# Patient Record
Sex: Male | Born: 2000
Health system: Southern US, Community
[De-identification: ages and names within clinical notes are randomized; demographics above are authoritative.]

## PROBLEM LIST (undated history)

## (undated) DIAGNOSIS — Z91018 Allergy to other foods: Secondary | ICD-10-CM

## (undated) DIAGNOSIS — K219 Gastro-esophageal reflux disease without esophagitis: Secondary | ICD-10-CM

## (undated) DIAGNOSIS — R001 Bradycardia, unspecified: Secondary | ICD-10-CM

## (undated) DIAGNOSIS — J302 Other seasonal allergic rhinitis: Secondary | ICD-10-CM

## (undated) DIAGNOSIS — S62509A Fracture of unspecified phalanx of unspecified thumb, initial encounter for closed fracture: Secondary | ICD-10-CM

## (undated) HISTORY — DX: Allergy to other foods: Z91.018

## (undated) HISTORY — PX: ADENOIDECTOMY: SUR15

## (undated) HISTORY — PX: TYMPANOSTOMY TUBE PLACEMENT: SHX32

## (undated) HISTORY — DX: Gastro-esophageal reflux disease without esophagitis: K21.9

## (undated) HISTORY — PX: TONSILLECTOMY: SUR1361

## (undated) NOTE — *Deleted (*Deleted)
   10/20/19 1816  Vital Signs  Temp 98.6 F (37 C)  Temp Source Oral  Pulse Rate 76  Pulse Rate Source Dinamap  Resp 16  BP 111/87  BP Location Left Arm  BP Method Automatic  Patient Position (if appropriate) Sitting  Oxygen Therapy  SpO2 99 %   D: Pt denies SI/HI/AVH. Patient denies anxiety and depression. Pt. Out in open areas. In the morning pt. Was irritable.  In the afternoon pt. Was out in open areas and was social with staff. Pt. Talked to this nurse about the language differences between the [place] and the [place]. Pt.. Was making sense in this conversation. Earlier pt. Stated that he had gone to [employer] and he was studying biology. He stated that he quit because he was  "too smart" A:  Patient took scheduled medicine.  Support and encouragement provided Routine safety checks conducted every 15 minutes. Patient  Informed to notify staff with any concerns.   R:  Safety maintained.

---
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
## 2018-08-27 DIAGNOSIS — J020 Streptococcal pharyngitis: Secondary | ICD-10-CM | POA: Diagnosis not present

## 2019-01-25 DIAGNOSIS — Z20828 Contact with and (suspected) exposure to other viral communicable diseases: Secondary | ICD-10-CM | POA: Diagnosis not present

## 2019-04-17 ENCOUNTER — Ambulatory Visit: Payer: BLUE CROSS/BLUE SHIELD

## 2019-04-18 ENCOUNTER — Ambulatory Visit: Payer: BC Managed Care – PPO | Attending: Pediatrics

## 2019-10-16 ENCOUNTER — Emergency Department (HOSPITAL_BASED_OUTPATIENT_CLINIC_OR_DEPARTMENT_OTHER)
Admission: EM | Admit: 2019-10-16 | Discharge: 2019-10-16 | Disposition: A | Discharge status: Home / Self Care | Payer: BC Managed Care – PPO | Attending: Emergency Medicine | Admitting: Emergency Medicine

## 2019-10-16 ENCOUNTER — Other Ambulatory Visit: Payer: Self-pay

## 2019-10-16 ENCOUNTER — Encounter (HOSPITAL_BASED_OUTPATIENT_CLINIC_OR_DEPARTMENT_OTHER): Payer: Self-pay | Admitting: *Deleted

## 2019-10-16 DIAGNOSIS — R4182 Altered mental status, unspecified: Secondary | ICD-10-CM | POA: Diagnosis present

## 2019-10-16 DIAGNOSIS — F19122 Other psychoactive substance abuse with intoxication with perceptual disturbances: Secondary | ICD-10-CM | POA: Insufficient documentation

## 2019-10-16 DIAGNOSIS — F1290 Cannabis use, unspecified, uncomplicated: Secondary | ICD-10-CM | POA: Diagnosis not present

## 2019-10-16 DIAGNOSIS — F19922 Other psychoactive substance use, unspecified with intoxication with perceptual disturbance: Secondary | ICD-10-CM

## 2019-10-16 DIAGNOSIS — F12122 Cannabis abuse with intoxication with perceptual disturbance: Secondary | ICD-10-CM | POA: Diagnosis not present

## 2019-10-16 DIAGNOSIS — E876 Hypokalemia: Secondary | ICD-10-CM | POA: Insufficient documentation

## 2019-10-16 LAB — CBC WITH DIFFERENTIAL/PLATELET
Abs Immature Granulocytes: 0.05 10*3/uL (ref 0.00–0.07)
Basophils Absolute: 0 10*3/uL (ref 0.0–0.1)
Basophils Relative: 0 %
Eosinophils Absolute: 0 10*3/uL (ref 0.0–0.5)
Eosinophils Relative: 0 %
HCT: 45.1 % (ref 39.0–52.0)
Hemoglobin: 15.1 g/dL (ref 13.0–17.0)
Immature Granulocytes: 0 %
Lymphocytes Relative: 10 %
Lymphs Abs: 1.1 10*3/uL (ref 0.7–4.0)
MCH: 30.8 pg (ref 26.0–34.0)
MCHC: 33.5 g/dL (ref 30.0–36.0)
MCV: 92 fL (ref 80.0–100.0)
Monocytes Absolute: 1 10*3/uL (ref 0.1–1.0)
Monocytes Relative: 9 %
Neutro Abs: 9.5 10*3/uL — ABNORMAL HIGH (ref 1.7–7.7)
Neutrophils Relative %: 81 %
Platelets: 294 10*3/uL (ref 150–400)
RBC: 4.9 MIL/uL (ref 4.22–5.81)
RDW: 12.4 % (ref 11.5–15.5)
WBC: 11.7 10*3/uL — ABNORMAL HIGH (ref 4.0–10.5)
nRBC: 0 % (ref 0.0–0.2)

## 2019-10-16 LAB — RAPID URINE DRUG SCREEN, HOSP PERFORMED
Amphetamines: NOT DETECTED
Barbiturates: NOT DETECTED
Benzodiazepines: NOT DETECTED
Cocaine: NOT DETECTED
Opiates: NOT DETECTED
Tetrahydrocannabinol: POSITIVE — AB

## 2019-10-16 LAB — URINALYSIS, ROUTINE W REFLEX MICROSCOPIC
Bilirubin Urine: NEGATIVE
Glucose, UA: NEGATIVE mg/dL
Hgb urine dipstick: NEGATIVE
Ketones, ur: 15 mg/dL — AB
Leukocytes,Ua: NEGATIVE
Nitrite: NEGATIVE
Protein, ur: NEGATIVE mg/dL
Specific Gravity, Urine: 1.015 (ref 1.005–1.030)
pH: 7.5 (ref 5.0–8.0)

## 2019-10-16 LAB — COMPREHENSIVE METABOLIC PANEL
ALT: 15 U/L (ref 0–44)
AST: 25 U/L (ref 15–41)
Albumin: 4.9 g/dL (ref 3.5–5.0)
Alkaline Phosphatase: 51 U/L (ref 38–126)
Anion gap: 11 (ref 5–15)
BUN: 10 mg/dL (ref 6–20)
CO2: 25 mmol/L (ref 22–32)
Calcium: 9.3 mg/dL (ref 8.9–10.3)
Chloride: 102 mmol/L (ref 98–111)
Creatinine, Ser: 1.1 mg/dL (ref 0.61–1.24)
GFR, Estimated: >60 mL/min (ref >60)
Glucose, Bld: 83 mg/dL (ref 70–99)
Potassium: 3.2 mmol/L — ABNORMAL LOW (ref 3.5–5.1)
Sodium: 138 mmol/L (ref 135–145)
Total Bilirubin: 1.5 mg/dL — ABNORMAL HIGH (ref 0.3–1.2)
Total Protein: 7.4 g/dL (ref 6.5–8.1)

## 2019-10-16 LAB — ETHANOL: Alcohol, Ethyl (B): <10 mg/dL (ref <10)

## 2019-10-16 LAB — SALICYLATE LEVEL: Salicylate Lvl: <7 mg/dL — ABNORMAL LOW (ref 7.0–30.0)

## 2019-10-16 LAB — ACETAMINOPHEN LEVEL: Acetaminophen (Tylenol), Serum: <10 ug/mL — ABNORMAL LOW (ref 10–30)

## 2019-10-16 MED ORDER — POTASSIUM CHLORIDE CRYS ER 20 MEQ PO TBCR
20.0000 meq | EXTENDED_RELEASE_TABLET | Freq: Once | ORAL | Status: AC
  Administered 2019-10-16: 20 meq via ORAL
  Filled 2019-10-16: qty 1

## 2019-10-16 MED ORDER — OLANZAPINE 5 MG PO TBDP
5.0000 mg | ORAL_TABLET | Freq: Every day | ORAL | Status: DC
  Administered 2019-10-16: 5 mg via ORAL
  Filled 2019-10-16: qty 1

## 2019-10-16 NOTE — ED Triage Notes (Signed)
Father states altered mental status today , unknown ingestion, " odd behavior"

## 2019-10-16 NOTE — ED Notes (Signed)
Pt attempted to void in urinal for urine sample that is needed, unable to void.

## 2019-10-16 NOTE — ED Notes (Signed)
Pt has eaten and feels better to go home, continues to exhibit bizzare behavior, leaving with father and went over d/c paperwork including behavioral health urgent care

## 2019-10-16 NOTE — ED Notes (Signed)
Pt is resting, no distress.  Offered pt and visitor snacks but they had ordered food. Gave 2 cokes

## 2019-10-16 NOTE — ED Notes (Signed)
Pt will remain in ED for a while longer as father is concerned about him not being himself.

## 2019-10-16 NOTE — Discharge Instructions (Signed)
If Kenneth Griffith develops any new or worsening symptoms, please return him to the emergency department for reevaluation.  It was a pleasure to meet you both.
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
## 2019-10-16 NOTE — ED Provider Notes (Addendum)
[facility] EMERGENCY DEPARTMENT Provider Note   CSN: [id number] Arrival date & time: 10/16/19  1554     History Chief Complaint  Patient presents with  . Altered Mental Status    Kenneth Griffith is a 19 y.o. male.  HPI   Patient is a 19 year old male with a medical history as noted below.  His father is at bedside and provides most of the patient's history.  He states that the patient is a [employer] student and [place] in [place].  He states that his son apparently went to a "rave" on Wednesday night.  Patient states that he smoked marijuana but denies any other drug use.  His father states that he came [place] to [place], [place] yesterday, unbeknownst to his parents, and then stayed at a friend's house who has a reputation for slipping people drugs.  Patient denies any known drug use yesterday. His father states this morning he was contacted because his son was driving to his mother's house in [place] and ran out of gas.  He was escorted to a gas station by police who helped him fill up his vehicle.  His father was contacted and followed him to his mother's house in [place].  He was later contacted by the patient's mother stating that he was "acting differently".  He notes to me that he feels that his son was acting differently this morning as well.  Patient denies any physical complaints at this time.  He appears diaphoretic and will at times provide odd answers to questions but otherwise is A&O x4.  Patient states he smokes marijuana and consumes marijuana regularly but denies any other drug use.  He does note taking mushrooms, LSD as well as using MDMA about 2 years ago during his freshman year of college but denies any repeat or recent occurrences.       Past Medical History:  Diagnosis Date  . Bradycardia    had cardiology workup in 2013:  "structurally normal heart"  . Food allergy    Tree Nuts  . Gastroesophageal reflux    no current med.  .  Seasonal allergies   . Thumb fracture 04/2014   right    Patient Active Problem List   Diagnosis Date Noted  . Anaphylaxis due to tree nut 08/04/2015  . Perennial allergic rhinitis 08/04/2015  . Other seasonal allergic rhinitis 08/04/2015  . 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 Gastroesophageal reflux     Past Surgical History:  Procedure Laterality Date  . ADENOIDECTOMY    . OPEN REDUCTION INTERNAL FIXATION (ORIF) METACARPAL Right 05/04/2014   Procedure: OPEN REDUCTION INTERNAL FIXATION (ORIF) RIGHT THUMB ;  Surgeon: Cindee SaltGary Kuzma, MD;  Location: [facility];  Service: Orthopedics;  Laterality: Right;  . PERCUTANEOUS PINNING  04/11/2011   Procedure: PERCUTANEOUS PINNING EXTREMITY;  Surgeon: Nicki ReaperGary R Kuzma, MD;  Location: [facility];  Service: Orthopedics;  Laterality: Right;  PERCUTANEOUS PINNING RIGHT LITTLE AND RING FINGERS  . TONSILLECTOMY  age 59 yrs.  . TYMPANOSTOMY TUBE PLACEMENT  as an infant      Family History  Problem Relation Age of Onset  . Diabetes Maternal Grandfather   . Kidney disease Maternal Grandfather        not on dialysis yet    Social History   Tobacco Use  . Smoking status: Never Smoker  . Smokeless tobacco: Never Used  Substance Use Topics  . Alcohol use: No  .  Drug use: Yes    Types: Marijuana    Home Medications Prior to Admission medications   Medication Sig Start Date End Date Taking? Authorizing Provider  amoxicillin-clavulanate (AUGMENTIN) 875-125 MG tablet Take 1 tablet by mouth every 12 (twelve) hours. 05/23/17   Elvina Sidle, MD  AUVI-Q 0.3 MG/0.3ML SOAJ injection Use as directed for life-threatening allergic reaction. 07/24/16   Kozlow, Alvira Philips, MD  EPINEPHrine (EPIPEN 2-PAK) 0.3 mg/0.3 mL IJ SOAJ injection Use as directed for life-threatening allergic reaction.    [provider]  loratadine (CLARITIN) 10 MG tablet Take 10 mg by mouth daily as needed for allergies.    [provider]    Allergies    Tree extract  Review of Systems   Review of Systems  All other systems reviewed and are negative. Ten systems reviewed and are negative for acute change, except as noted in the HPI.     Physical Exam Updated Vital Signs BP 122/86 (BP Location: Left Arm)   Pulse 76   Temp 98.3 F (36.8 C) (Oral)   Resp 18   Ht 5\' 9"  (1.753 m)   Wt 68 kg   SpO2 100%   BMI 22.15 kg/m   Physical Exam Vitals and nursing note reviewed.  Constitutional:      General: He is not in acute distress.    Appearance: Normal appearance. He is diaphoretic. He is not ill-appearing or toxic-appearing.     Comments: Mildly diaphoretic appearing.  Appears anxious.  He speaks in complete sentences.  At times, will provide inappropriate answers to questions.  HENT:     Head: Normocephalic and atraumatic.     Right Ear: External ear normal.     Left Ear: External ear normal.     Nose: Nose normal.     Mouth/Throat:     Mouth: Mucous membranes are moist.     Pharynx: Oropharynx is clear. No oropharyngeal exudate or posterior oropharyngeal erythema.  Eyes:     General: No scleral icterus.       Right eye: No discharge.        Left eye: No discharge.     Extraocular Movements: Extraocular movements intact.     Conjunctiva/sclera: Conjunctivae normal.     Pupils: Pupils are equal, round, and reactive to light.     Comments: Pupils mildly dilated.  They are equal, round, reactive to light.  Extraocular movements are intact.  Cardiovascular:     Rate and Rhythm: Normal rate and regular rhythm.     Pulses: Normal pulses.     Heart sounds: Normal heart sounds. No murmur heard.  No friction rub. No gallop.   Pulmonary:     Effort: Pulmonary effort is normal. No respiratory distress.     Breath sounds: Normal breath sounds. No stridor. No wheezing, rhonchi or rales.  Abdominal:     General: Abdomen is flat.     Tenderness: There is no abdominal tenderness.  Musculoskeletal:        General:  Normal range of motion.     Cervical back: Normal range of motion and neck supple. No tenderness.  Skin:    General: Skin is warm.  Neurological:     General: No focal deficit present.     Mental Status: He is alert and oriented to person, place, and time.     Comments: Patient is oriented to person, place, and time. Negative arm drift. Finger to nose intact bilaterally with no visible signs of dysmetria,  though patient was performing this test as fast as possible and had to be instructed to slow his movements down. Strength is 5/5 in all four extremities. Distal sensation intact in all four extremities.  Psychiatric:        Mood and Affect: Mood is anxious.        Behavior: Behavior normal. Behavior is not agitated, aggressive, hyperactive or combative.    ED Results / Procedures / Treatments   Labs (all labs ordered are listed, but only abnormal results are displayed) Labs Reviewed  COMPREHENSIVE METABOLIC PANEL - Abnormal; Notable for the following components:      Result Value   Potassium 3.2 (*)    Total Bilirubin 1.5 (*)    All other components within normal limits  URINALYSIS, ROUTINE W REFLEX MICROSCOPIC - Abnormal; Notable for the following components:   APPearance CLOUDY (*)    Ketones, ur 15 (*)    All other components within normal limits  RAPID URINE DRUG SCREEN, HOSP PERFORMED - Abnormal; Notable for the following components:   Tetrahydrocannabinol POSITIVE (*)    All other components within normal limits  CBC WITH DIFFERENTIAL/PLATELET - Abnormal; Notable for the following components:   WBC 11.7 (*)    Neutro Abs 9.5 (*)    All other components within normal limits  SALICYLATE LEVEL - Abnormal; Notable for the following components:   Salicylate Lvl <7.0 (*)    All other components within normal limits  ACETAMINOPHEN LEVEL - Abnormal; Notable for the following components:   Acetaminophen (Tylenol), Serum <10 (*)    All other components within normal limits    ETHANOL   EKG EKG Interpretation  Date/Time:  Friday October 16 2019 17:31:53 EDT Ventricular Rate:  54 PR Interval:    QRS Duration: 110 QT Interval:  442 QTC Calculation: 419 R Axis:   88 Text Interpretation: Sinus rhythm Short PR interval Confirmed by Tilden Fossa [phone]) on 10/16/2019 5:41:03 PM  Radiology No results found.  Procedures Procedures (including critical care time)  Medications Ordered in ED Medications  potassium chloride SA (KLOR-CON) CR tablet 20 mEq (20 mEq Oral Given 10/16/19 1849)    ED Course  I have reviewed the triage vital signs and the nursing notes.  Pertinent labs & imaging results that were available during my care of the patient were reviewed by me and considered in my medical decision making (see chart for details).  Clinical Course as of Oct 16 1846  Fri Oct 16, 2019  1810 Alcohol, Ethyl (B): <10 [LJ]  1811 Tetrahydrocannabinol(!): POSITIVE [LJ]    Clinical Course User Index [LJ] Placido Sou, PA-C   MDM Rules/Calculators/A&P                          Pt is a 19 y.o. male that presents with a history, physical exam, and ED Clinical Course as noted above.   Patient presents today with his father due to a possible ingestion.  He endorses marijuana use but denies any other recent drug use.  He was at a friend's house last night and is unsure if he was given any drugs during this encounter.  His father states that this morning he began exhibiting odd behavior and he ultimately brought him to the emergency department for evaluation.  Labs today were generally reassuring.  Mild leukocytosis of 11.7 with a neutrophilia of 9.5.  Likely reactive.  Hypokalemia of 3.2 which was repleted with Klor-Con.  UDS positive for  THC but otherwise negative. Patient's vital signs been stable throughout his visit.  He is afebrile, nontachycardic, normotensive, not hypoxic.  Patient was discussed with and evaluated by my attending physician.  Patient was  given a referral to peer support.  Father and the patient were offered to stay in the emergency department until his symptoms fully resolved for go home and return to the ED with any new or worsening symptoms.  They prefer to be discharged at this time and are planning on returning with any new or worsening symptoms.  Upon discharge, patient and his father decided that they would like to stay in the emergency department a while longer until patient is clinically sober.  It is the end of my shift and patient care will be transferred to my attending physician Dr. Tilden Fossa.  An After Visit Summary was printed and given to the patient.  Condition at discharge: Stable  Note: Portions of this report may have been transcribed using voice recognition software. Every effort was made to ensure accuracy; however, inadvertent computerized transcription errors may be present.   Final Clinical Impression(s) / ED Diagnoses Final diagnoses:  Drug intoxication with perceptual disturbance [facility])   Rx / DC Orders ED Discharge Orders    None       Placido Sou, PA-C 10/16/19 1850    Placido Sou, PA-C 10/16/19 1858    Tilden Fossa, MD 10/16/19 2353

## 2019-10-18 ENCOUNTER — Other Ambulatory Visit: Payer: Self-pay

## 2019-10-18 ENCOUNTER — Emergency Department (HOSPITAL_COMMUNITY)
Admission: EM | Admit: 2019-10-18 | Discharge: 2019-10-18 | Disposition: A | Discharge status: Home / Self Care | Payer: BC Managed Care – PPO | Source: Home / Self Care | Attending: Emergency Medicine | Admitting: Emergency Medicine

## 2019-10-18 ENCOUNTER — Encounter (HOSPITAL_COMMUNITY): Payer: Self-pay | Admitting: Emergency Medicine

## 2019-10-18 ENCOUNTER — Emergency Department (HOSPITAL_COMMUNITY): Payer: BC Managed Care – PPO

## 2019-10-18 ENCOUNTER — Ambulatory Visit (HOSPITAL_COMMUNITY)
Admission: EM | Admit: 2019-10-18 | Discharge: 2019-10-18 | Disposition: A | Discharge status: Other Acute Inpatient Hospital | Payer: BC Managed Care – PPO

## 2019-10-18 ENCOUNTER — Inpatient Hospital Stay (HOSPITAL_COMMUNITY)
Admission: AD | Admit: 2019-10-18 | Discharge: 2019-10-23 | DRG: 885 | Disposition: A | Discharge status: Home / Self Care | Payer: BC Managed Care – PPO | Source: Intra-hospital | Attending: Psychiatry | Admitting: Psychiatry

## 2019-10-18 DIAGNOSIS — F23 Brief psychotic disorder: Secondary | ICD-10-CM | POA: Insufficient documentation

## 2019-10-18 DIAGNOSIS — F12959 Cannabis use, unspecified with psychotic disorder, unspecified: Secondary | ICD-10-CM | POA: Insufficient documentation

## 2019-10-18 DIAGNOSIS — F19251 Other psychoactive substance dependence with psychoactive substance-induced psychotic disorder with hallucinations: Secondary | ICD-10-CM | POA: Insufficient documentation

## 2019-10-18 DIAGNOSIS — K30 Functional dyspepsia: Secondary | ICD-10-CM | POA: Diagnosis present

## 2019-10-18 DIAGNOSIS — Z792 Long term (current) use of antibiotics: Secondary | ICD-10-CM | POA: Diagnosis not present

## 2019-10-18 DIAGNOSIS — Z833 Family history of diabetes mellitus: Secondary | ICD-10-CM

## 2019-10-18 DIAGNOSIS — E876 Hypokalemia: Secondary | ICD-10-CM | POA: Diagnosis not present

## 2019-10-18 DIAGNOSIS — K5900 Constipation, unspecified: Secondary | ICD-10-CM | POA: Diagnosis not present

## 2019-10-18 DIAGNOSIS — K219 Gastro-esophageal reflux disease without esophagitis: Secondary | ICD-10-CM | POA: Diagnosis present

## 2019-10-18 DIAGNOSIS — Z20822 Contact with and (suspected) exposure to covid-19: Secondary | ICD-10-CM | POA: Diagnosis present

## 2019-10-18 DIAGNOSIS — F17200 Nicotine dependence, unspecified, uncomplicated: Secondary | ICD-10-CM | POA: Diagnosis not present

## 2019-10-18 DIAGNOSIS — F1290 Cannabis use, unspecified, uncomplicated: Secondary | ICD-10-CM | POA: Diagnosis not present

## 2019-10-18 DIAGNOSIS — J301 Allergic rhinitis due to pollen: Secondary | ICD-10-CM | POA: Diagnosis not present

## 2019-10-18 DIAGNOSIS — F29 Unspecified psychosis not due to a substance or known physiological condition: Secondary | ICD-10-CM | POA: Diagnosis not present

## 2019-10-18 DIAGNOSIS — R202 Paresthesia of skin: Secondary | ICD-10-CM | POA: Diagnosis not present

## 2019-10-18 DIAGNOSIS — Z87891 Personal history of nicotine dependence: Secondary | ICD-10-CM | POA: Insufficient documentation

## 2019-10-18 DIAGNOSIS — R4182 Altered mental status, unspecified: Secondary | ICD-10-CM | POA: Insufficient documentation

## 2019-10-18 DIAGNOSIS — Z79899 Other long term (current) drug therapy: Secondary | ICD-10-CM

## 2019-10-18 DIAGNOSIS — F419 Anxiety disorder, unspecified: Secondary | ICD-10-CM | POA: Diagnosis not present

## 2019-10-18 DIAGNOSIS — F1920 Other psychoactive substance dependence, uncomplicated: Secondary | ICD-10-CM | POA: Diagnosis not present

## 2019-10-18 DIAGNOSIS — R001 Bradycardia, unspecified: Secondary | ICD-10-CM | POA: Diagnosis not present

## 2019-10-18 DIAGNOSIS — Z7289 Other problems related to lifestyle: Secondary | ICD-10-CM | POA: Diagnosis not present

## 2019-10-18 LAB — CBC
HCT: 43 % (ref 39.0–52.0)
Hemoglobin: 13.8 g/dL (ref 13.0–17.0)
MCH: 30 pg (ref 26.0–34.0)
MCHC: 32.1 g/dL (ref 30.0–36.0)
MCV: 93.5 fL (ref 80.0–100.0)
Platelets: 284 10*3/uL (ref 150–400)
RBC: 4.6 MIL/uL (ref 4.22–5.81)
RDW: 12.3 % (ref 11.5–15.5)
WBC: 6.5 10*3/uL (ref 4.0–10.5)
nRBC: 0 % (ref 0.0–0.2)

## 2019-10-18 LAB — RAPID URINE DRUG SCREEN, HOSP PERFORMED
Amphetamines: NOT DETECTED
Barbiturates: NOT DETECTED
Benzodiazepines: NOT DETECTED
Cocaine: NOT DETECTED
Opiates: NOT DETECTED
Tetrahydrocannabinol: POSITIVE — AB

## 2019-10-18 LAB — COMPREHENSIVE METABOLIC PANEL
ALT: 20 U/L (ref 0–44)
AST: 40 U/L (ref 15–41)
Albumin: 4.6 g/dL (ref 3.5–5.0)
Alkaline Phosphatase: 55 U/L (ref 38–126)
Anion gap: 8 (ref 5–15)
BUN: 12 mg/dL (ref 6–20)
CO2: 27 mmol/L (ref 22–32)
Calcium: 9.4 mg/dL (ref 8.9–10.3)
Chloride: 104 mmol/L (ref 98–111)
Creatinine, Ser: 1.19 mg/dL (ref 0.61–1.24)
GFR, Estimated: >60 mL/min (ref >60)
Glucose, Bld: 78 mg/dL (ref 70–99)
Potassium: 3.7 mmol/L (ref 3.5–5.1)
Sodium: 139 mmol/L (ref 135–145)
Total Bilirubin: 1.8 mg/dL — ABNORMAL HIGH (ref 0.3–1.2)
Total Protein: 6.8 g/dL (ref 6.5–8.1)

## 2019-10-18 LAB — RESPIRATORY PANEL BY RT PCR (FLU A&B, COVID)
Influenza A by PCR: NEGATIVE
Influenza B by PCR: NEGATIVE
SARS Coronavirus 2 by RT PCR: NEGATIVE

## 2019-10-18 LAB — ACETAMINOPHEN LEVEL: Acetaminophen (Tylenol), Serum: <10 ug/mL — ABNORMAL LOW (ref 10–30)

## 2019-10-18 LAB — SALICYLATE LEVEL: Salicylate Lvl: <7 mg/dL — ABNORMAL LOW (ref 7.0–30.0)

## 2019-10-18 LAB — ETHANOL: Alcohol, Ethyl (B): <10 mg/dL (ref <10)

## 2019-10-18 NOTE — Progress Notes (Signed)
Patient has been accepted to Room 302-01 at [facility] to the service of MD Clary - pending negative COVID result and within normal limit CT scan that is ordered.  Report may be called to [phone] when transportation has been arranged.

## 2019-10-18 NOTE — ED Notes (Signed)
Dad, Abelina Bachelor, took all patient belongings home.

## 2019-10-18 NOTE — BH Assessment (Signed)
Comprehensive Clinical Assessment (CCA) Note  10/18/2019 Kenneth Griffith [id number]   Patient presents to [facility] with his father.  Patient is acutely psychotic, most likely from using marijuana.  His father reports that for the last two days that patient has been doing bizarre things.  He has pasted stuff all over the walls, he has removed screens from windows, he has injured the family dog, he has taken things apart in the house for unknown reasons.  Patient is agitated and very guarded and according to father, providing information that is not true.  Patient states that he is a daily marijuana user and states that he has been using marijuana since he was 19 years old.  Patient also admits to abusing alcohol, but amounts are unknown.  Father reports that patient attended a Rave Party approximately a week ago.  Patient denies other drug use other than marijuana.  Patient  denies current SI/HI and states that he has he has never tried to cause any harm to himself or others in the past and he states that he has never been treated for mental illness in the past.  Patient did not sleep at all last night.  He did not report any changes in his appetite.  Patient denies any history of abuse or self mutilation.  Patient is oriented and alert with the exception of the month, he thought it was September and he states that he does not remember doing the things that his father is reporting.  Patient's judgment, insight and impulse control are impaired. He is guarded and irritable and not invested in answering questions. His speech is pressured.  He avoids eye contact and has a bizarre affect.   Visit Diagnosis:      ICD-10-CM   1. Brief psychotic disorder (HCC)  F23     2.      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 Family/Friend  Referral name: Kenneth Griffith  Referral phone number: No data  recorded  Whom do you see for routine medical problems? Primary Care  Practice/Facility Name: Dr. Madilyn Griffith  Practice/Facility Phone Number: No data recorded Name of Contact: No data recorded Contact Number: No data recorded Contact Fax Number: No data recorded Prescriber Name: No data recorded Prescriber Address (if known): No data recorded  What Is the Reason for Your Visit/Call Today? Patient presented with substance induced psychosis. Father, states that patient's behavior has been bizarre for the past two days  How Long Has This Been Causing You Problems? 1 wk - 1 month  What Do You Feel Would Help You the Most Today? Other (Comment) (patient is too psychotic to identify level of care that he needs)   Have You Recently Been in Any Inpatient Treatment (Hospital/Detox/Crisis Center/28-Day Program)? No  Name/Location of Program/Hospital:No data recorded How Long Were You There? No data recorded When Were You Discharged? No data recorded  Have You Ever Received Services From [facility] Before? Yes  Who Do You See at [facility]? has been to ED for sport injuries   Have You Recently Had Any Thoughts About Hurting Yourself? No  Are You Planning to Commit Suicide/Harm Yourself At This time? No   Have you Recently Had Thoughts About Hurting Someone Kenneth Griffith? No  Explanation: No data recorded  Have  You Used Any Alcohol or Drugs in the Past 24 Hours? No (patient states that he last used marijuana and alcohol 2 days ago)  How Long Ago Did You Use Drugs or Alcohol? No data recorded What Did You Use and How Much? No data recorded  Do You Currently Have a Therapist/Psychiatrist? No  Name of Therapist/Psychiatrist: No data recorded  Have You Been Recently Discharged From Any Office Practice or Programs? No  Explanation of Discharge From Practice/Program: No data recorded    CCA Screening Triage Referral Assessment Type of Contact: Face-to-Face  Is this Initial or Reassessment?  No data recorded Date Telepsych consult ordered in [facility]:  No data recorded Time Telepsych consult ordered in [facility]:  No data recorded  Patient Reported Information Reviewed? Yes  Patient Left Without Being Seen? No data recorded Reason for Not Completing Assessment: No data recorded  Collateral Involvement: Father was present during the assessment process   Does Patient Have a Court Appointed Legal Guardian? No data recorded Name and Contact of Legal Guardian: No data recorded If Minor and Not Living with Parent(s), Who has Custody? No data recorded Is CPS involved or ever been involved? Never  Is APS involved or ever been involved? Never   Patient Determined To Be At Risk for Harm To Self or Others Based on Review of Patient Reported Information or Presenting Complaint? Yes, for Harm to Others (patient is psychotic and not rational, has not stated that he is homicidal or suicidal, but his behavior is currently unpredictable.)  Method: No Plan  Availability of Means: No access or NA  Intent: Vague intent or NA  Notification Required: No need or identified person  Additional Information for Danger to Others Potential: Active psychosis  Additional Comments for Danger to Others Potential: No data recorded Are There Guns or Other Weapons in Your Home? No  Types of Guns/Weapons: No data recorded Are These Weapons Safely Secured?                            No data recorded Who Could Verify You Are Able To Have These Secured: No data recorded Do You Have any Outstanding Charges, Pending Court Dates, Parole/Probation? none  Contacted To Inform of Risk of Harm To Self or Others: Other: Comment (parents are aware of situation and patient is being involuntarily committed)   [facility]   Does Patient Present under Involuntary Commitment? No (Petitioned by [facility] Provider)  IVC Papers Initial File Date: No data recorded  [place]   Patient Currently Receiving the Following Services: Not Receiving Services   Determination of Need: Emergent (2 hours)   Options For Referral: Inpatient Hospitalization     CCA Biopsychosocial  Intake/Chief Complaint:  CCA Intake With Chief Complaint CCA Part Two Date: 10/18/19 CCA Part Two Time: 1236 Chief Complaint/Presenting Problem: [facility].  Patient is acutely psychotic, most likely from using marijuana.  His father reports that for the last two days that patient has been doing bizarre things.  He has pasted stuff all over the walls, he has removed screens from windows, he has injured the family dog, he has taken things apart in the house for unknown reasons.  Patient is agitated and very guarded and according to father, providing information that is not true.  Patient states that he is a daily marijuana user and states that he has been using  marijuana since he was 19 years old.  Patient also admits to abusing alcohol, but amounts are unknown.  Father reports that patient attended a Rave Party approximately a week ago.  Patient denies other drug use other than marijuana.  Patient  denies current SI/HI and states that he has he has never tried to cause any harm to himself or others in the past and he states that he has never been treated for mental illness in the past.  Patient did not sleep at all last night.  He did not report any changes in his appetite.  Patient denies any history of abuse or self mutilation. Patient's Currently Reported Symptoms/Problems: Patient is exhibiting bizarre behaviors and he is very guarded Individual's Strengths: Patient is not cooperative with answering questions Individual's Preferences: Patient has no preferences that require accommodation Individual's Abilities: Patient is not cooperative with answering questions Type of Services Patient Feels Are Needed: Patient does not feel like he needs any mental  health services  Mental Health Symptoms Depression:  Depression: Change in energy/activity, Sleep (too much or little), Duration of symptoms less than two weeks  Mania:  Mania: Change in energy/activity, Irritability, Overconfidence, Recklessness  Anxiety:   Anxiety: None  Psychosis:  Psychosis: Duration of symptoms less than six months, Other negative symptoms  Trauma:  Trauma: None  Obsessions:  Obsessions: None  Compulsions:  Compulsions: None  Inattention:  Inattention: None  Hyperactivity/Impulsivity:  Hyperactivity/Impulsivity: N/A  Oppositional/Defiant Behaviors:  Oppositional/Defiant Behaviors: None  Emotional Irregularity:  Emotional Irregularity: Potentially harmful impulsivity  Other Mood/Personality Symptoms:      Mental Status Exam Appearance and self-care  Stature:  Stature: Average  Weight:  Weight: Thin  Clothing:  Clothing: Casual  Grooming:  Grooming: Normal  Cosmetic use:  Cosmetic Use: None  Posture/gait:  Posture/Gait: Normal  Motor activity:  Motor Activity: Not Remarkable  Sensorium  Attention:  Attention: Normal  Concentration:  Concentration: Normal  Orientation:  Orientation: Object, Person, Place, Situation  Recall/memory:  Recall/Memory: Normal  Affect and Mood  Affect:  Affect: Flat  Mood:  Mood: Irritable  Relating  Eye contact:  Eye Contact: Avoided  Facial expression:  Facial Expression: Tense  Attitude toward examiner:  Attitude Toward Examiner: Guarded, Irritable  Thought and Language  Speech flow: Speech Flow: Pressured  Thought content:  Thought Content: Appropriate to Mood and Circumstances  Preoccupation:  Preoccupations: None  Hallucinations:  Hallucinations: None  Organization:     Company secretary of Knowledge:  Fund of Knowledge: Good  Intelligence:  Intelligence: Above Average  Abstraction:  Abstraction: Functional  Judgement:  Judgement: Impaired  Reality Testing:  Reality Testing: Unaware  Insight:  Insight:  Lacking  Decision Making:  Decision Making: Impulsive  Social Functioning  Social Maturity:  Social Maturity: Impulsive  Social Judgement:  Social Judgement: Normal  Stress  Stressors:  Stressors: Family conflict, Work, Housing  Coping Ability:  Coping Ability: Exhausted, Normal  Skill Deficits:  Skill Deficits: Scientist, physiological  Supports:  Supports: Family     Religion: Religion/Spirituality Are You A Religious Person?: No  Leisure/Recreation: Leisure / Recreation Do You Have Hobbies?: Yes Leisure and Hobbies: hx of playing soccer  Exercise/Diet: Exercise/Diet Do You Exercise?: Yes What Type of Exercise Do You Do?: Run/Walk, Weight Training How Many Times a Week Do You Exercise?: 1-3 times a week Have You Gained or Lost A Significant Amount of Weight in the Past Six Months?: Yes-Lost Number of Pounds Lost?:  (amount unknown) Do You Follow a  Special Diet?: No Do You Have Any Trouble Sleeping?: No   CCA Employment/Education  Employment/Work Situation: Employment / Work Situation Employment situation: Unemployed Patient's job has been impacted by current illness: No What is the longest time patient has a held a job?: patient has only had one job that he kept for a couple months Where was the patient employed at that time?: [employer] Has patient ever been in the [employer]?: No  Education: Education Is Patient Currently Attending School?: No Last Grade Completed: 12 Name of High School: [employer] Did Garment/textile technologist From [employer]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 What Type of College Degree Do you Have?: [employer] Did You Attend Graduate School?: No What Was Your Major?: Pre-med then to business Did You Have An Individualized Education Program (IIEP): No Did You Have Any Difficulty At School?: No Patient's Education Has Been Impacted by Current Illness: No   CCA Family/Childhood History  Family and Relationship  History: Family history Marital status: Single Are you sexually active?: Yes What is your sexual orientation?: heterosexual Has your sexual activity been affected by drugs, alcohol, medication, or emotional stress?: unable to assess Does patient have children?: No  Childhood History:  Childhood History By whom was/is the patient raised?: Both parents Additional childhood history information: patient's parents are divorced and mother remarried.  Parents had joint custody Description of patient's relationship with caregiver when they were a child: Patient is closer to his father, argues with mother Patient's description of current relationship with people who raised him/her: Patient is closer to his father, argues with mother How were you disciplined when you got in trouble as a child/adolescent?: Patient was disciplined appropriately Does patient have siblings?: No Did patient suffer any verbal/emotional/physical/sexual abuse as a child?: No Did patient suffer from severe childhood neglect?: No Has patient ever been sexually abused/assaulted/raped as an adolescent or adult?: No Was the patient ever a victim of a crime or a disaster?: No Witnessed domestic violence?: No Has patient been affected by domestic violence as an adult?: No  Child/Adolescent Assessment:     CCA Substance Use  Alcohol/Drug Use: Alcohol / Drug Use Pain Medications: see MAR Prescriptions: see MAR Over the Counter: see MAR History of alcohol / drug use?: Yes Longest period of sobriety (when/how long): none reported Negative Consequences of Use: Personal relationships, Work / Mining engineer #1 Name of Substance 1: cannabis 1 - Age of First Use: 10 1 - Amount (size/oz): unknown 1 - Frequency: daily 1 - Duration: since onset 1 - Last Use / Amount: unknown                       ASAM's:  Six Dimensions of Multidimensional Assessment  Dimension 1:  Acute Intoxication and/or Withdrawal  Potential:   Dimension 1:  Description of individual's past and current experiences of substance use and withdrawal: Patient has no current withdrawal symptoms  Dimension 2:  Biomedical Conditions and Complications:   Dimension 2:  Description of patient's biomedical conditions and  complications: Patient has no current medical issues that are exacerbated by his use of marijuana  Dimension 3:  Emotional, Behavioral, or Cognitive Conditions and Complications:  Dimension 3:  Description of emotional, behavioral, or cognitive conditions and complications: Patient's marijuana use has induced psychosis  Dimension 4:  Readiness to Change:  Dimension 4:  Description of Readiness to Change criteria: Patient has not expressed any desire to change his drug use.  Dimension 5:  Relapse, Continued use, or Continued Problem Potential:  Dimension 5:  Relapse, continued use, or continued problem potential critiera description: Patient has no significant period of abstinence  Dimension 6:  Recovery/Living Environment:  Dimension 6:  Recovery/Iiving environment criteria description: Patient has a safe and supportive environment to recover in  ASAM Severity Score: ASAM's Severity Rating Score: 10  ASAM Recommended Level of Treatment: ASAM Recommended Level of Treatment: Level II Intensive Outpatient Treatment   Substance use Disorder (SUD) Substance Use Disorder (SUD)  Checklist Symptoms of Substance Use: Continued use despite persistent or recurrent social, interpersonal problems, caused or exacerbated by use, Presence of craving or strong urge to use, Recurrent use that results in a failure to fulfill major role obligations (work, school, home), Social, occupational, recreational activities given up or reduced due to use, Substance(s) often taken in larger amounts or over longer times than was intended  Recommendations for Services/Supports/Treatments: Recommendations for Services/Supports/Treatments Recommendations  For Services/Supports/Treatments: CD-IOP Intensive Chemical Dependency Program  DSM5 Diagnoses: Patient Active Problem List   Diagnosis Date Noted  . Cannabis-induced psychotic disorder (HCC)     Disposition:  Per Berneice Heinrich, NP, inpatient treatment is recommended.  Patient was placed on IVC.   Referrals to Alternative Service(s): Referred to Alternative Service(s):   Place:   Date:   Time:    Referred to Alternative Service(s):   Place:   Date:   Time:    Referred to Alternative Service(s):   Place:   Date:   Time:    Referred to Alternative Service(s):   Place:   Date:   Time:     Arnoldo Lenis Darby Fleeman

## 2019-10-18 NOTE — ED Provider Notes (Signed)
Behavioral Health Admission H&P [facility] & OBS)  Date: 10/18/19 Patient Name: Kenneth Griffith MRN: [id number] Chief Complaint: No chief complaint on file.     Diagnoses:  Final diagnoses:  Brief psychotic disorder [facility])    HPI: Patient presents voluntarily to [facility] behavioral health center for walk-in assessment.  Patient alert and oriented, reluctantly participates in assessment.  Patient states "I do not know why I am here."  Patient request that his father remain present during assessment.  Patient asked that father explain reasons for today's visit.  Patient's Lanny Cramp, reports increasingly bizarre behaviors x3 days.  Patient's father reports patient was seen at [facility] on Friday for similar symptoms. Patient was treated for substance induced psychosis.  Patient's father reports Zyprexa was initiated at that time.  Patient's father denies any radiologic studies were completed of patient's head to his knowledge.  Patient's father denies any family history of mental illness.  According to father, patient has not slept in approximately 48 hours.  Patient's father describes bizarre behaviors including physically assaulting the family dog x2 last night.  Patient also noted to be using tape measure to measure the family dog.  Patient reports dog was injured in a dog fight however according to patient's father this did not occur.  Per patient's father patient exhibited destructive behavior in his father's home.  In father's home patient removed screens from windows to "see more clearly." Patient noted by father to be disassembling items in home. Patient knocking on walls then writing on walls in fathers home.     Patient exhibits bizarre behavior on assessment.  Patient noted to hold up 2 fingers states "this means November."  Upon arrival patient registered as a 19 year old, patient actually 19 years old.  Patient reports on Wednesday he attended a "rave party" in [place].  Patient reports using alcohol and marijuana, patient reports "making edibles using marihuana."  Patient reports recent losing of time, patient reports periods of time when he cannot remember the things that have occurred.  Patient reports inability to recall some of the behaviors described by his father.  On evaluation patient appears labile and guarded.  Patient's speech is clear and coherent, tangential in nature.  Patient affect is anxious.  Patient denies suicidal ideations, denies any history of suicide attempts or self-harm behaviors.  Patient denies homicidal ideations.  Patient denies both auditory and visual hallucinations but pauses prior to answering this question.  Patient presents with disorganized conversation.  Patient reports "what if I need to put the dog in the car?"   Patient appears mildly paranoid.  Patient states I do not understand why people think I need to come here, I did not know this was a mental health hospital."  Patient resides in [place] with his father.  Patient denies access to weapons.  Patient reports he is currently working "doing a lot of things."  Patient's father reports patient is not employed.  Patient endorses rare alcohol use and daily marijuana use.  Patient states "I love marijuana."  Offered support and encouragement to patient and father.  Patient gives verbal consent to speak with father.  Spoke with patient's father who agrees with plan for involuntary commitment petition at this time.  Involuntary commitment petition completed.  PHQ 2-9:     Total Time spent with patient: 45 minutes  Musculoskeletal  Strength & Muscle Tone: within normal limits Gait & Station: normal Patient leans: N/A  Psychiatric Specialty Exam  Presentation General Appearance: Appropriate for  Environment  Eye Contact:Fair  Speech:Clear and Coherent  Speech Volume:Normal  Handedness:Right   Mood and Affect  Mood:Anxious;Irritable  Affect:Labile   Thought  Process  Thought Processes:Disorganized  Descriptions of Associations:Tangential  Orientation:Full (Time, Place and Person)  Thought Content:Abstract Reasoning;Tangential  Hallucinations:Hallucinations: None  Ideas of Reference:None  Suicidal Thoughts:Suicidal Thoughts: No  Homicidal Thoughts:Homicidal Thoughts: No   Sensorium  Memory:Immediate Poor;Recent Poor;Remote Fair  Judgment:Impaired  Insight:Lacking   Executive Functions  Concentration:Poor  Attention Span:Poor  Recall:Fair  Fund of Knowledge:Fair  Language:Fair   Psychomotor Activity  Psychomotor Activity:Psychomotor Activity: Normal   Assets  Assets:Communication Skills;Financial Resources/Insurance;Housing;Intimacy;Physical Health;Resilience;Social Support   Sleep  Sleep:Sleep: Poor   Physical Exam Vitals and nursing note reviewed.  Constitutional:      Appearance: He is well-developed.  HENT:     Head: Normocephalic.  Cardiovascular:     Rate and Rhythm: Normal rate.  Pulmonary:     Effort: Pulmonary effort is normal.  Neurological:     Mental Status: He is alert and oriented to person, place, and time.  Psychiatric:        Mood and Affect: Mood is anxious. Affect is labile and inappropriate.        Speech: Speech is tangential.        Behavior: Behavior is cooperative.        Thought Content: Thought content is paranoid.        Cognition and Memory: Cognition is impaired. He exhibits impaired recent memory.        Judgment: Judgment is inappropriate.    Review of Systems  Constitutional: Negative.   HENT: Negative.   Eyes: Negative.   Respiratory: Negative.   Cardiovascular: Negative.   Gastrointestinal: Negative.   Genitourinary: Negative.   Musculoskeletal: Negative.   Skin: Negative.   Neurological: Negative.   Endo/Heme/Allergies: Negative.   Psychiatric/Behavioral: Positive for substance abuse. The patient is nervous/anxious and has insomnia.     Blood pressure  (!) 144/91, pulse 80, temperature 98.4 F (36.9 C), temperature source Tympanic, SpO2 99 %. There is no height or weight on file to calculate BMI.  Past Psychiatric History: Substance induced psychosis   Is the patient at risk to self? Yes  Has the patient been a risk to self in the past 6 months? Yes .    Has the patient been a risk to self within the distant past? Yes   Is the patient a risk to others? No   Has the patient been a risk to others in the past 6 months? No   Has the patient been a risk to others within the distant past? No   Past Medical History: No past medical history on file.  Family History: No family history on file.  Social History:  Social History   Socioeconomic History  . Marital status: Not on file    Spouse name: Not on file  . Number of children: Not on file  . Years of education: Not on file  . Highest education level: Not on file  Occupational History  . Not on file  Tobacco Use  . Smoking status: Not on file  Substance and Sexual Activity  . Alcohol use: Not on file  . Drug use: Not on file  . Sexual activity: Not on file  Other Topics Concern  . Not on file  Social History Narrative  . Not on file   Social Determinants of Health   Financial Resource Strain:   . Difficulty of Paying Living  Expenses: Not on file  Food Insecurity:   . Worried About Programme researcher, broadcasting/film/video in the Last Year: Not on file  . Ran Out of Food in the Last Year: Not on file  Transportation Needs:   . Lack of Transportation (Medical): Not on file  . Lack of Transportation (Non-Medical): Not on file  Physical Activity:   . Days of Exercise per Week: Not on file  . Minutes of Exercise per Session: Not on file  Stress:   . Feeling of Stress : Not on file  Social Connections:   . Frequency of Communication with Friends and Family: Not on file  . Frequency of Social Gatherings with Friends and Family: Not on file  . Attends Religious Services: Not on file  . Active  Member of Clubs or Organizations: Not on file  . Attends Banker Meetings: Not on file  . Marital Status: Not on file  Intimate Partner Violence:   . Fear of Current or Ex-Partner: Not on file  . Emotionally Abused: Not on file  . Physically Abused: Not on file  . Sexually Abused: Not on file    SDOH:  SDOH Screenings   Alcohol Screen:   . Last Alcohol Screening Score (AUDIT): Not on file  Depression (PHQ2-9):   . PHQ-2 Score: Not on file  Financial Resource Strain:   . Difficulty of Paying Living Expenses: Not on file  Food Insecurity:   . Worried About Programme researcher, broadcasting/film/video in the Last Year: Not on file  . Ran Out of Food in the Last Year: Not on file  Housing:   . Last Housing Risk Score: Not on file  Physical Activity:   . Days of Exercise per Week: Not on file  . Minutes of Exercise per Session: Not on file  Social Connections:   . Frequency of Communication with Friends and Family: Not on file  . Frequency of Social Gatherings with Friends and Family: Not on file  . Attends Religious Services: Not on file  . Active Member of Clubs or Organizations: Not on file  . Attends Banker Meetings: Not on file  . Marital Status: Not on file  Stress:   . Feeling of Stress : Not on file  Tobacco Use:   . Smoking Tobacco Use: Not on file  . Smokeless Tobacco Use: Not on file  Transportation Needs:   . Lack of Transportation (Medical): Not on file  . Lack of Transportation (Non-Medical): Not on file    Last Labs:  No results found for any previous visit.    Allergies: Patient has no allergy information on record.  PTA Medications: (Not in a hospital admission)   Medical Decision Making  Patient will be involuntarily committed and transferred to the emergency department for medical clearance and to await appropriate inpatient psychiatric placement.  Patient presents with psychosis, possibly related to substance use disorder.  Discussed restarting  Zyprexa with patient.  Patient could benefit from Zyprexa 5 mg twice daily to address symptoms of psychosis.    Recommendations  Based on my evaluation the patient appears to have an emergency medical condition for which I recommend the patient be transferred to the emergency department for further evaluation.   Patient reviewed with Dr. Nelly Rout. Inpatient psychiatric treatment recommended.  Patrcia Dolly, FNP 10/18/19  11:51 AM

## 2019-10-18 NOTE — ED Notes (Signed)
Dad is leaving ED and going home.  Dad states that since he picked pt up from his mothers hours 2 days ago, he has not been acting usual self.  For instance, dad states pt has been walking around house with air gun and cocking it; took things out of dads office and laid them on the floor; put leash around dogs neck and was trying to lift him by leash for brief time; the dog does have a leg injury and dad doesn't know how that happened.  At pts mothers house last week pt turned furniture upside down in the house; pt took big rocks and put on hood of his car.

## 2019-10-18 NOTE — ED Triage Notes (Signed)
Pt to ED with [employer] and his dad.  Presents with IVC papers from [facility].  Pt states he was diagnosed with cannabis induced psychosis and dad reports odd behavior.  Pt denies SI/HI.  Cooperative at this time.  Dad states he was seen at [facility] on Friday.

## 2019-10-18 NOTE — ED Notes (Signed)
Pt belongings with family member and changed into purple scrubs Wanded by security  [employer] still with pt in triage Pt given [place] sandwich and water

## 2019-10-18 NOTE — ED Provider Notes (Signed)
[place] EMERGENCY DEPARTMENT Provider Note   CSN: [id number] Arrival date & time: 10/18/19  1334     History Chief Complaint  Patient presents with  . IVC    Kenneth Griffith is a 19 y.o. male who presents to the ED under IVC placed by [facility] provider.   IVC paperwork states: "Respondent is acutely psychotic must likely drug induced. His behaviors have been bizarre and out of the ordinary. He did not sleep last night and was up all night pasting things on the wall, he took screens out of windows, injured the family pet, taking things apart in the home. He is currently very agitated and unable to make rational decisions and is a danger to self/others in his current state of mind."   Per chart review: Pt was seen at [facility] ED on 10/08 for similar bizarre behavior. Labwork showed hypokalemia of 3.2 and a UDS only positive for THC. Pt/father stayed in the ED until pt's symptoms seemed to improve and he was discharged home. Please see previous ED Note for additional information.   Father reports that since then pt has continued to act very strangely. He states that he has been taking screens off of windows in the home for unknown reasons. He also mentions that pt has been taking trash out of the trashcan and placing it back into the sink. Father found pt rummaging through his office yesterday and noticed that he had a few screws in his hand - dad assumed he disassembled something however he is unsure what he disassembled. Dad noticed today that their dog has a wound on its side and is unsure how it got there however he found pt standing near the dog with a tape measure extended towards the dog and a "tooth whitening kit" in his mouth - when dad asked pt was happened he responded "the dog attacked me." Dad took pt to [facility] earlier today and he was sent here under IVC paperwork placed by [facility] provider. Pt denies SI, HI, or AVH. Pt thinks his behavior is completely normal.   The history  is provided by the patient, a parent and medical records.       Past Medical History:  Diagnosis Date  . Bradycardia    had cardiology workup in 2013:  "structurally normal heart"  . Food allergy    Tree Nuts  . Gastroesophageal reflux    no current med.  . Seasonal allergies   . Thumb fracture 04/2014   right    Patient Active Problem List   Diagnosis Date Noted  . Anaphylaxis due to tree nut 08/04/2015  . Perennial allergic rhinitis 08/04/2015  . Other seasonal allergic rhinitis 08/04/2015  . 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 Gastroesophageal reflux     Past Surgical History:  Procedure Laterality Date  . ADENOIDECTOMY    . OPEN REDUCTION INTERNAL FIXATION (ORIF) METACARPAL Right 05/04/2014   Procedure: OPEN REDUCTION INTERNAL FIXATION (ORIF) RIGHT THUMB ;  Surgeon: Daryll Brod, MD;  Location: [place];  Service: Orthopedics;  Laterality: Right;  . PERCUTANEOUS PINNING  04/11/2011   Procedure: PERCUTANEOUS PINNING EXTREMITY;  Surgeon: Wynonia Sours, MD;  Location: [place];  Service: Orthopedics;  Laterality: Right;  PERCUTANEOUS PINNING RIGHT LITTLE AND RING FINGERS  . TONSILLECTOMY  age 20 yrs.  . TYMPANOSTOMY TUBE PLACEMENT  as an infant       Family History  Problem Relation Age of Onset  .  Diabetes Maternal Grandfather   . Kidney disease Maternal Grandfather        not on dialysis yet    Social History   Tobacco Use  . Smoking status: Former Research scientist (life sciences)  . Smokeless tobacco: Never Used  Substance Use Topics  . Alcohol use: Yes  . Drug use: Yes    Types: Marijuana    Home Medications Prior to Admission medications   Medication Sig Start Date End Date Taking? Authorizing Provider  amoxicillin-clavulanate (AUGMENTIN) 875-125 MG tablet Take 1 tablet by mouth every 12 (twelve) hours. 05/23/17   Robyn Haber, MD  AUVI-Q 0.3 MG/0.3ML SOAJ injection Use as directed for life-threatening allergic reaction. 07/24/16    Kozlow, Donnamarie Poag, MD  EPINEPHrine (EPIPEN 2-PAK) 0.3 mg/0.3 mL IJ SOAJ injection Use as directed for life-threatening allergic reaction.    [provider]  loratadine (CLARITIN) 10 MG tablet Take 10 mg by mouth daily as needed for allergies.    [provider]    Allergies    Tree extract  Review of Systems   Review of Systems  Constitutional: Negative for fever.  Psychiatric/Behavioral: Positive for behavioral problems.    Physical Exam Updated Vital Signs BP 120/70 (BP Location: Right Arm)   Pulse (!) 57   Temp 98.2 F (36.8 C)   Resp 16   Ht '5\' 10"'  (1.778 m)   Wt 68 kg   SpO2 100%   BMI 21.52 kg/m   Physical Exam Vitals and nursing note reviewed.  Constitutional:      Appearance: He is not ill-appearing or diaphoretic.  HENT:     Head: Normocephalic and atraumatic.  Eyes:     Extraocular Movements: Extraocular movements intact.     Conjunctiva/sclera: Conjunctivae normal.     Pupils: Pupils are equal, round, and reactive to light.  Cardiovascular:     Rate and Rhythm: Normal rate and regular rhythm.     Pulses: Normal pulses.  Pulmonary:     Effort: Pulmonary effort is normal.     Breath sounds: Normal breath sounds. No wheezing, rhonchi or rales.  Abdominal:     Palpations: Abdomen is soft.     Tenderness: There is no abdominal tenderness.  Musculoskeletal:     Cervical back: Neck supple.  Skin:    General: Skin is warm and dry.  Neurological:     Mental Status: He is alert.     Comments: CN 3-12 grossly intact A&O x4 GCS 15 Sensation and strength intact Gait nonataxic including with tandem walking Coordination with finger-to-nose WNL Neg romberg, neg pronator drift     ED Results / Procedures / Treatments   Labs (all labs ordered are listed, but only abnormal results are displayed) Labs Reviewed  COMPREHENSIVE METABOLIC PANEL - Abnormal; Notable for the following components:      Result Value   Total Bilirubin 1.8 (*)    All  other components within normal limits  SALICYLATE LEVEL - Abnormal; Notable for the following components:   Salicylate Lvl <1.6 (*)    All other components within normal limits  ACETAMINOPHEN LEVEL - Abnormal; Notable for the following components:   Acetaminophen (Tylenol), Serum <10 (*)    All other components within normal limits  RAPID URINE DRUG SCREEN, HOSP PERFORMED - Abnormal; Notable for the following components:   Tetrahydrocannabinol POSITIVE (*)    All other components within normal limits  RESPIRATORY PANEL BY RT PCR (FLU A&B, COVID)  ETHANOL  CBC    EKG None  Radiology CT Head Wo Contrast  Result Date: 10/18/2019 CLINICAL DATA:  19 year old male with altered mental status. EXAM: CT HEAD WITHOUT CONTRAST TECHNIQUE: Contiguous axial images were obtained from the base of the skull through the vertex without intravenous contrast. COMPARISON:  None. FINDINGS: Brain: No evidence of acute infarction, hemorrhage, hydrocephalus, extra-axial collection or mass lesion/mass effect. Vascular: No hyperdense vessel or unexpected calcification. Skull: Normal. Negative for fracture or focal lesion. Sinuses/Orbits: There is diffuse mucoperiosteal thickening of paranasal sinuses. There is complete opacification of the right frontal sinus. Right maxillary sinus retention cyst or polyp. No air-fluid level. The mastoid air cells are clear. Other: None IMPRESSION: 1. Normal noncontrast CT of the brain. 2. Paranasal sinus disease. Electronically Signed   By: Anner Crete M.D.   On: 10/18/2019 17:30    Procedures Procedures (including critical care time)  Medications Ordered in ED Medications - No data to display  ED Course  I have reviewed the triage vital signs and the nursing notes.  Pertinent labs & imaging results that were available during my care of the patient were reviewed by me and considered in my medical decision making (see chart for details).    MDM Rules/Calculators/A&P                           19 year old male who presents to the ED after being placed under IVC paperwork by [facility] provider earlier today for drug-induced psychosis.  He was seen in the ED at [facility] 2 days ago for similar behavior and had a UDS positive for THC only.  Patient reports he went to a rate of early last week and ate a pot brownie and smoked some marijuana, he denies any other drug use.  Question if the marijuana was laced with something however father states that before he went to the rave on Wednesday he was completely fine.  On arrival to the ED patient is afebrile, nontachycardic nontachypneic.  He appears to be in no acute distress.  He is cooperative during exam however states that all of the behavior that dad is listing is completely normal.  He does not think anything is wrong.  He denies SI, HI, AVH.  Dad does mention that at [facility] they mentioned something about a CT scan of his head - I spoke with Ricky Ala, NP who saw patient earlier today; she asked dad if a CT scan was done during the earlier ED visit 2 days ago which it does not appear it was. Pt is without any focal neuro deficit on exam however given new altered mental status will plan for CT head. Will plan to medically screen pt and then have TTS eval.   CT Head negative Labwork without any abnormalities and potassium appears normalized toady at 3.7. UDS positive for THC. Pt is medically cleared at this time. Awaiting TTS eval.   Appears to be a note stating that pt has been accepted to [facility] pending negative COVID test. He has pre admission orders however continues to not have a note from a provider at either [facility] or TTS.   COVID test negative. Pt to be transferred to [facility]. Father Gardner Candle made aware.   This note was prepared using Dragon voice recognition software and may include unintentional dictation errors due to the inherent limitations of voice recognition software.  Final Clinical Impression(s) / ED  Diagnoses Final diagnoses:  Psychosis, unspecified psychosis type ([place])  Rx / DC Orders ED Discharge Orders    None       Eustaquio Maize, PA-C 10/18/19 2214    Isla Pence, MD 10/19/19 [phone]

## 2019-10-19 ENCOUNTER — Encounter (HOSPITAL_COMMUNITY): Payer: Self-pay

## 2019-10-19 ENCOUNTER — Other Ambulatory Visit: Payer: Self-pay

## 2019-10-19 DIAGNOSIS — F29 Unspecified psychosis not due to a substance or known physiological condition: Secondary | ICD-10-CM | POA: Diagnosis present

## 2019-10-19 DIAGNOSIS — F23 Brief psychotic disorder: Principal | ICD-10-CM

## 2019-10-19 MED ORDER — HYDROXYZINE HCL 25 MG PO TABS
25.0000 mg | ORAL_TABLET | Freq: Three times a day (TID) | ORAL | Status: DC | PRN
  Administered 2019-10-20 – 2019-10-21 (×3): 25 mg via ORAL
  Filled 2019-10-19 (×5): qty 1

## 2019-10-19 MED ORDER — TRAZODONE HCL 50 MG PO TABS
50.0000 mg | ORAL_TABLET | Freq: Every evening | ORAL | Status: DC | PRN
  Administered 2019-10-19: 50 mg via ORAL
  Filled 2019-10-19 (×2): qty 1

## 2019-10-19 MED ORDER — ACETAMINOPHEN 325 MG PO TABS
650.0000 mg | ORAL_TABLET | Freq: Four times a day (QID) | ORAL | Status: DC | PRN
  Administered 2019-10-21: 650 mg via ORAL
  Filled 2019-10-19: qty 2

## 2019-10-19 MED ORDER — MAGNESIUM HYDROXIDE 400 MG/5ML PO SUSP: 30.0000 mL | Freq: Every day | ORAL | Status: DC | PRN

## 2019-10-19 MED ORDER — OLANZAPINE 5 MG PO TBDP
5.0000 mg | ORAL_TABLET | Freq: Two times a day (BID) | ORAL | Status: DC
  Administered 2019-10-19 – 2019-10-20 (×3): 5 mg via ORAL
  Filled 2019-10-19 (×7): qty 1

## 2019-10-19 MED ORDER — ALUM & MAG HYDROXIDE-SIMETH 200-200-20 MG/5ML PO SUSP: 30.0000 mL | ORAL | Status: DC | PRN

## 2019-10-19 NOTE — Tx Team (Signed)
Initial Treatment Plan 10/19/2019 12:54 AM Kenneth Griffith KGS:[id number]    PATIENT STRESSORS: Financial difficulties Substance abuse   PATIENT STRENGTHS: Average or above average intelligence Communication skills Supportive family/friends   PATIENT IDENTIFIED PROBLEMS: Psychosis  Substance abuse        "learn to better communicate"           DISCHARGE CRITERIA:  Improved stabilization in mood, thinking, and/or behavior Motivation to continue treatment in a less acute level of care Need for constant or close observation no longer present  PRELIMINARY DISCHARGE PLAN: Return to previous living arrangement  PATIENT/FAMILY INVOLVEMENT: This treatment plan has been presented to and reviewed with the patient, Kenneth Griffith.  The patient and family have been given the opportunity to ask questions and make suggestions.  Juliann Pares, RN 10/19/2019, 12:54 AM
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
## 2019-10-19 NOTE — [facility] Suicide Risk Assessment (Signed)
[facility] INPATIENT:  Family/Significant Other Suicide Prevention Education  Suicide Prevention Education:  Education Completed; Gibson Lad [phone] (Father) has been identified by the patient as the family member/significant other with whom the patient will be residing, and identified as the person(s) who will aid the patient in the event of a mental health crisis (suicidal ideations/suicide attempt).  With written consent from the patient, the family member/significant other has been provided the following suicide prevention education, prior to the and/or following the discharge of the patient.  The suicide prevention education provided includes the following:  Suicide risk factors  Suicide prevention and interventions  National Suicide Hotline telephone number  [facility] assessment telephone number  [facility] Emergency Assistance 911  [facility] and/or Residential Mobile Crisis Unit telephone number  Request made of family/significant other to:  Remove weapons (e.g., guns, rifles, knives), all items previously/currently identified as safety concern.    Remove drugs/medications (over-the-counter, prescriptions, illicit drugs), all items previously/currently identified as a safety concern.  The family member/significant other verbalizes understanding of the suicide prevention education information provided.  The family member/significant other agrees to remove the items of safety concern listed above.   Mr. Jaquith states that over the weekend his son was acting strange.  Mr. Conover states that his son went to [facility] to see friends.  Mr. Bones states that his son was there for one week and then came home on Friday.  Mr. Selsor states that his son was brought home by police because the car was out of gas.  Mr. Janusz states that once he had gas in the car his son went to his mother's house where it sounded like he was Hallucinating because he kept  stating that his step-father was in the home when he was not.  Mr. Shepard states that his son has always been honest about what he does and states that he smoked Marijuana at a Rave while at the [place].  Mr. Claiborne states that his son smokes Marijuana often and has admitted to drinking alcohol.  Mr. Vanderwall states that the firearms and weapons in his home are locked up and secure.  Mr. Nealy states that his son's mother has also locked up all firearms and weapons.  Mr. Acuna asked the CSW many questions and states that he was hoping that the behavior would be attributed to Diabetes.  Mr. Mitcham states that he does not want to believe that this is a mental health disorder because no one on either side of the family has had a mental health issue.       Metro Kung Tashima Scarpulla 10/19/2019, 2:47 PM

## 2019-10-19 NOTE — Progress Notes (Signed)
Psychoeducational Group Note  Date:  10/19/2019 Time:  2110  Group Topic/Focus:  Wrap-Up Group:   The focus of this group is to help patients review their daily goal of treatment and discuss progress on daily workbooks.  Participation Level: Did Not Attend  Participation Quality:  Not Applicable  Affect:  Not Applicable  Cognitive:  Not Applicable  Insight:  Not Applicable  Engagement in Group: Not Applicable  Additional Comments:  The patient did not attend group this evening.   Hazle Coca S 10/19/2019, 9:10 PM
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
## 2019-10-19 NOTE — Progress Notes (Signed)
Admission note:  Pt is a 19 year old Caucasian male IVC'd by his father for bizarre behavior.  See note by Berneice Heinrich, FNP.  Upon admission Pt is cooperative, seems unsure of why he is here.  Pt asked how long he was to stay.  Pt reports allergy to tree nuts, no other allergies.  Pt states he uses marijuana and occasional alcohol but denies any other substance abuse.  Pt does report that he smokes and requested nicorette gum.  Pt is pleasant but forwards little during admission process.  Meal provided, and Pt shown to room 302.

## 2019-10-19 NOTE — Progress Notes (Signed)
Recreation Therapy Notes  Date:  10.11.21 Time: 0930 Location: 300 Hall Dayroom  Group Topic: Stress Management  Goal Area(s) Addresses:  Patient will identify positive stress management techniques. Patient will identify benefits of using stress management post d/c.  Intervention: Stress Management  Activity : Meditation.  LRT played a meditation that focused on making the most of each moment of the day.  Patients are to listen and follow along as meditation is played.   Education:  Stress Management, Discharge Planning.   Education Outcome: Acknowledges Education  Clinical Observations/Feedback: Pt did not attend group.    Caroll Rancher, LRT/CTRS         Caroll Rancher A 10/19/2019 12:30 PM

## 2019-10-19 NOTE — Progress Notes (Signed)
   10/19/19 2112  COVID-19 Daily Checkoff  Have you had a fever (temp > 37.80C/100F)  in the past 24 hours?  No  COVID-19 EXPOSURE  Have you traveled outside the state in the past 14 days? No  Have you been in contact with someone with a confirmed diagnosis of COVID-19 or PUI in the past 14 days without wearing appropriate PPE? No  Have you been living in the same home as a person with confirmed diagnosis of COVID-19 or a PUI (household contact)? No  Have you been diagnosed with COVID-19? No

## 2019-10-19 NOTE — [facility] Counselor (Signed)
Adult Comprehensive Assessment  Patient ID: Kenneth Griffith, male   DOB: 04-01-2000, 19 y.o.   MRN: [id number]  Information Source: Information source: Patient  Current Stressors:  Patient states their primary concerns and needs for treatment are:: "Some PA at the hospital said I needed to be here" Patient states their goals for this hospitilization and ongoing recovery are:: "To get out of here because I dont need to be here" Educational / Learning stressors: Pt reports that he was in college until August 2019 and dropped out due to online classes Employment / Job issues: Pt reports working at [employer] for 5 to 6 months and unemployed for approximately 1 month Family Relationships: Pt reports no stressors Surveyor, quantity / Lack of resources (include bankruptcy): Pt reports no stressors Housing / Lack of housing: Pt reports that he is living at various friend's homes and refused to tell the CSW where is permanent residence was located Physical health (include injuries & life threatening diseases): Pt reports no stressors Social relationships: Pt reports no stressors Substance abuse: Pt reports using Alcohol and Marijuana daily but reports that he can control it and hasnt smoked as much in the last month Bereavement / Loss: Pt reports no stressors  Living/Environment/Situation:  Living Arrangements: Non-relatives/Friends Living conditions (as described by patient or guardian): Pt reports living in various homes and "couch surfing" Who else lives in the home?: Various friends How long has patient lived in current situation?: "For a long time" What is atmosphere in current home: Temporary, Comfortable  Family History:  Marital status: Single Are you sexually active?: Yes What is your sexual orientation?: Heterosexual Has your sexual activity been affected by drugs, alcohol, medication, or emotional stress?: No Does patient have children?: No  Childhood History:  By whom was/is  the patient raised?: Both parents Additional childhood history information: "I bounced between homes as a child so I guess that is why I do that now" Description of patient's relationship with caregiver when they were a child: "really well" Patient's description of current relationship with people who raised him/her: "really well" How were you disciplined when you got in trouble as a child/adolescent?: Spankings Does patient have siblings?: Yes Number of Siblings: 3 Description of patient's current relationship with siblings: "I have 2 step-brothers and 1 step-sister and we get along really well but we dont see each other much because they live in other states and counties" Did patient suffer any verbal/emotional/physical/sexual abuse as a child?: No Did patient suffer from severe childhood neglect?: No Has patient ever been sexually abused/assaulted/raped as an adolescent or adult?: No Was the patient ever a victim of a crime or a disaster?: No Witnessed domestic violence?: No Has patient been affected by domestic violence as an adult?: No  Education:  Highest grade of school patient has completed: Some college Currently a Consulting civil engineer?: No Learning disability?: No  Employment/Work Situation:   Employment situation: Unemployed Patient's job has been impacted by current illness: No What is the longest time patient has a held a job?: 5 to 6 months Where was the patient employed at that time?: [employer] Has patient ever been in the [employer]?: No  Financial Resources:   Surveyor, quantity resources: No income, Media planner Does patient have a Lawyer or guardian?: No  Alcohol/Substance Abuse:   What has been your use of drugs/alcohol within the last 12 months?: Pt reports that he drinks alcohol and smokes Marijuana daily but is able to control it and has not used  either as much in the last month If attempted suicide, did drugs/alcohol play a role in this?:  No Alcohol/Substance Abuse Treatment Hx: Denies past history Has alcohol/substance abuse ever caused legal problems?: No  Social Support System:   Patient's Community Support System: Good Describe Community Support System: Family, friends Type of faith/religion: Ephriam Knuckles How does patient's faith help to cope with current illness?: "I read bible verses"  Leisure/Recreation:   Do You Have Hobbies?: Yes Leisure and Hobbies: "Watch and play sports"  Strengths/Needs:   What is the patient's perception of their strengths?: "Reading people" Patient states they can use these personal strengths during their treatment to contribute to their recovery: Pt did not specify Patient states these barriers may affect/interfere with their treatment: None Patient states these barriers may affect their return to the community: None  Discharge Plan:   Currently receiving community mental health services: No Patient states concerns and preferences for aftercare planning are: Pt refuses all follow up and referrals Patient states they will know when they are safe and ready for discharge when: "I am ready to go now" Does patient have access to transportation?: Yes Does patient have financial barriers related to discharge medications?: No Will patient be returning to same living situation after discharge?: Yes  Summary/Recommendations:   Summary and Recommendations (to be completed by the evaluator): Kenneth Griffith is a 19 year old, Caucasian, male who was admitted to the hospital due to anxiety, aggression, and bizarre behavior at his father's home.  The Pt states that he "couch surfs" and has a more permanent residence but refuses to the the social worker where that home is located.  Pt reports that he is not employed and was attending school at [employer] but dropped out in 2019 due to virtual classes.  The Pt reports that he uses Marijuana and alcohol daily but states that he is able to control  it and has not used as much in the past month.  When asked how much he uses the Pt stated "A lot".  While in the hospital the Pt can benefit from crisis stabilization, medication evaluation, group therapy, psycho-education, case management, and discharge planning.  Upon discharge the Pt would like to return to "couch surfing" and has refused all follow-up services for therapy and medication.  Aram Beecham. 10/19/2019

## 2019-10-19 NOTE — Progress Notes (Signed)
ADULT GRIEF GROUP NOTE:  Spiritual care group on grief and loss facilitated by chaplain Nataline Basara  Group Goal:  Support / Education around grief and loss  Members engage in facilitated group support and psycho-social education.  Group Description:  Following introductions and group rules, group members engaged in facilitated group dialog and support around topic of loss, with particular support around experiences of loss in their lives. Group Identified types of loss (relationships / self / things) and identified patterns, circumstances, and changes that precipitate losses. Reflected on thoughts / feelings around loss, normalized grief responses, and recognized variety in grief experience.  Patient Progress: 

## 2019-10-19 NOTE — Plan of Care (Signed)
Progress note  D: pt found in bed; compliant with medication administration. Pt continues to isolate and is minimal with assessment. Pt seems agitated with questioning. Pt states they came here because they didn't have a place to sleep last night. Pt denies any physical complaints or pain. Pt is addiment that they aren't suicidal and have no avh. Pt denies si/hi/ah/vh and verbally agrees to approach staff if these become apparent or before harming themself/others while at bhh.  A: Pt provided support and encouragement. Pt given medication per protocol and standing orders. Q48m safety checks implemented and continued.  R: Pt safe on the unit. Will continue to monitor.  Pt progressing in the following metrics  Problem: Education: Goal: Knowledge of [facility] Education information/materials will improve Outcome: Progressing Goal: Emotional status will improve Outcome: Progressing Goal: Mental status will improve Outcome: Progressing Goal: Verbalization of understanding the information provided will improve Outcome: Progressing

## 2019-10-19 NOTE — Tx Team (Signed)
Interdisciplinary Treatment and Diagnostic Plan Update  10/19/2019 Time of Session: 9:05am  ZVI DUPLANTIS MRN: [id number]  Principal Diagnosis: <principal problem not specified>  Secondary Diagnoses: Active Problems:   Psychosis (HCC)   Current Medications:  Current Facility-Administered Medications  Medication Dose Route Frequency Provider Last Rate Last Admin  . acetaminophen (TYLENOL) tablet 650 mg  650 mg Oral Q6H PRN Patrcia Dolly, FNP      . alum & mag hydroxide-simeth (MAALOX/MYLANTA) 200-200-20 MG/5ML suspension 30 mL  30 mL Oral Q4H PRN Patrcia Dolly, FNP      . hydrOXYzine (ATARAX/VISTARIL) tablet 25 mg  25 mg Oral TID PRN Patrcia Dolly, FNP      . magnesium hydroxide (MILK OF MAGNESIA) suspension 30 mL  30 mL Oral Daily PRN Patrcia Dolly, FNP      . OLANZapine zydis (ZYPREXA) disintegrating tablet 5 mg  5 mg Oral BID Patrcia Dolly, FNP   5 mg at 10/19/19 0801  . traZODone (DESYREL) tablet 50 mg  50 mg Oral QHS PRN Patrcia Dolly, FNP       PTA Medications: Medications Prior to Admission  Medication Sig Dispense Refill Last Dose  . AUVI-Q 0.3 MG/0.3ML SOAJ injection Use as directed for life-threatening allergic reaction. 4 Device 3   . loratadine (CLARITIN) 10 MG tablet Take 10 mg by mouth daily as needed for allergies.       Patient Stressors: Financial difficulties Substance abuse  Patient Strengths: Average or above average intelligence Communication skills Supportive family/friends  Treatment Modalities: Medication Management, Group therapy, Case management,  1 to 1 session with clinician, Psychoeducation, Recreational therapy.   Physician Treatment Plan for Primary Diagnosis: <principal problem not specified> Long Term Goal(s):     Short Term Goals:    Medication Management: Evaluate patient's response, side effects, and tolerance of medication regimen.  Therapeutic Interventions: 1 to 1 sessions, Unit Group sessions and Medication administration.  Evaluation  of Outcomes: Progressing  Physician Treatment Plan for Secondary Diagnosis: Active Problems:   Psychosis (HCC)  Long Term Goal(s):     Short Term Goals:       Medication Management: Evaluate patient's response, side effects, and tolerance of medication regimen.  Therapeutic Interventions: 1 to 1 sessions, Unit Group sessions and Medication administration.  Evaluation of Outcomes: Progressing   RN Treatment Plan for Primary Diagnosis: <principal problem not specified> Long Term Goal(s): Knowledge of disease and therapeutic regimen to maintain health will improve  Short Term Goals: Ability to remain free from injury will improve, Ability to verbalize frustration and anger appropriately will improve, Ability to participate in decision making will improve, Ability to disclose and discuss suicidal ideas and Ability to identify and develop effective coping behaviors will improve  Medication Management: RN will administer medications as ordered by provider, will assess and evaluate patient's response and provide education to patient for prescribed medication. RN will report any adverse and/or side effects to prescribing provider.  Therapeutic Interventions: 1 on 1 counseling sessions, Psychoeducation, Medication administration, Evaluate responses to treatment, Monitor vital signs and CBGs as ordered, Perform/monitor CIWA, COWS, AIMS and Fall Risk screenings as ordered, Perform wound care treatments as ordered.  Evaluation of Outcomes: Progressing   LCSW Treatment Plan for Primary Diagnosis: <principal problem not specified> Long Term Goal(s): Safe transition to appropriate next level of care at discharge, Engage patient in therapeutic group addressing interpersonal concerns.  Short Term Goals: Engage patient in aftercare planning with referrals and resources, Increase social support,  Increase emotional regulation, Facilitate acceptance of mental health diagnosis and concerns, Facilitate  patient progression through stages of change regarding substance use diagnoses and concerns and Identify triggers associated with mental health/substance abuse issues  Therapeutic Interventions: Assess for all discharge needs, 1 to 1 time with Social worker, Explore available resources and support systems, Assess for adequacy in community support network, Educate family and significant other(s) on suicide prevention, Complete Psychosocial Assessment, Interpersonal group therapy.  Evaluation of Outcomes: Progressing   Progress in Treatment: Attending groups: No. Participating in groups: No. Taking medication as prescribed: Yes. Toleration medication: Yes. Family/Significant other contact made: No, will contact:  If consents are given  Patient understands diagnosis: No. Discussing patient identified problems/goals with staff: Yes. Medical problems stabilized or resolved: Yes. Denies suicidal/homicidal ideation: Yes. Issues/concerns per patient self-inventory: No.   New problem(s) identified: No, Describe:  None  New Short Term/Long Term Goal(s): medication stabilization, elimination of SI thoughts, development of comprehensive mental wellness plan.   Patient Goals:  "To get out of here"   Discharge Plan or Barriers: Patient recently admitted. CSW will continue to follow and assess for appropriate referrals and possible discharge planning.   Reason for Continuation of Hospitalization: Aggression Medication stabilization Withdrawal symptoms  Estimated Length of Stay: 3 to 5 days   Attendees: Patient: Kenneth Griffith  10/19/2019   Physician:  10/19/2019   Nursing: Earlene Plater, MD 10/19/2019   RN Care Manager: 10/19/2019   Social Worker: Melba Coon, LCSW 10/19/2019   Recreational Therapist:  10/19/2019   Other:  10/19/2019   Other:  10/19/2019   Other: 10/19/2019      Scribe for Treatment Team: Aram Beecham, LCSWA 10/19/2019 9:58 AM

## 2019-10-20 MED ORDER — OLANZAPINE 10 MG PO TBDP
10.0000 mg | ORAL_TABLET | Freq: Two times a day (BID) | ORAL | Status: DC
  Administered 2019-10-20 – 2019-10-21 (×2): 10 mg via ORAL
  Filled 2019-10-20 (×5): qty 1

## 2019-10-20 MED ORDER — LORAZEPAM 1 MG PO TABS
1.0000 mg | ORAL_TABLET | Freq: Once | ORAL | Status: AC
  Administered 2019-10-20: 1 mg via ORAL
  Filled 2019-10-20: qty 1

## 2019-10-20 MED ORDER — ZIPRASIDONE MESYLATE 20 MG IM SOLR: 10.0000 mg | Freq: Two times a day (BID) | INTRAMUSCULAR | Status: DC | PRN

## 2019-10-20 MED ORDER — OLANZAPINE 5 MG PO TBDP
5.0000 mg | ORAL_TABLET | Freq: Once | ORAL | Status: AC
  Administered 2019-10-20: 5 mg via ORAL
  Filled 2019-10-20: qty 1

## 2019-10-20 MED ORDER — TRAZODONE HCL 50 MG PO TABS
50.0000 mg | ORAL_TABLET | Freq: Every evening | ORAL | Status: DC | PRN
  Administered 2019-10-20 – 2019-10-22 (×5): 50 mg via ORAL
  Filled 2019-10-20 (×3): qty 1

## 2019-10-20 MED ORDER — LORAZEPAM 2 MG/ML IJ SOLN: 2.0000 mg | Freq: Two times a day (BID) | INTRAMUSCULAR | Status: DC | PRN

## 2019-10-20 NOTE — Progress Notes (Signed)
[facility] MD Progress Note  10/20/2019 10:32 AM [facility]  MRN:  [id number] Subjective: " Why people were checking on me every 15 mins at night?" Pt is seen and examined today. Pt minimizes his symptoms. Pt states he is irritable.  Pt didn't sleep well last night. He states that someone was checking on him every 15  minutes and states "it pissed me off, why do you have to check every 15 minutes when someone is sleeping?. He states he couldn't sleep because of that. He states he wants to go home as he is feeling fine. He states he talked to his dad recently. When asked what he talked about he states "Its none of your business". Pt states his appetite is ok. Currently, Pt denies any suicidal ideation, homicidal ideation and, visual and auditory hallucination. Pt c/o tingling in hands. Pt denies any headache, nausea, vomiting, dizziness, chest pain, SOB, abdominal pain, diarrhea, and constipation. Pt denies any medication side effects and has been tolerating it well. Pt denies any concerns.  Objective: Pt is 19  year old male with negative past psychiatric history [facility]/2019 for an assessment. Patient stated "I do not know why I am here." Patient requested that his father remain present during assessment. At initial assessment Patient's father,DavinLewis,reported increasingly bizarre behaviors x3 days. Today on Examination, He is lying on the bed, He has comb and a marker in his hands. He stated combing his hair and continued to comb throughout the interview. He had marks from a markers on his wrist and his hands. It appears that he wrote No 1 on his left hand. Pt appears disheveled, very irritable, paranoid and oriented x4. His speech is normal with normal volume. Pt's mood is euthymic with constricted affect.  No SI, HI or AVH. Paranoia present.  Blood pressure 130/75, pulse (!) 48, temperature 98.5 F (36.9 C), temperature source Oral, resp. rate 20, height  (1.778  m), weight 68 kg, SpO2 99 %. No new labs. Nursing notes indicate that Pt slept for 1.5 hours.The patient did not attend group last evening. Lats night he was mildly confused. Stated that he was here because his vision got bad. When asked about the day of the week, he said Tuesday. When asked about the current president, he said Trump and then Duane Boston. He has been isolative to his room. Yesterday night, Pt was paranoid and irritable and pt became irritable that the MHT was opening his door and had to check on him every 15 minutes. Pt complained that he was trying to sleep and didn't like that. Nurse attempted to open his door, but pt was pushing against it and wouldn't allow it. Principal Problem: Psychosis (HCC) Diagnosis: Principal Problem:   Psychosis (HCC)  Total Time spent with patient: 20 minutes  Past Psychiatric History: see H&P  Past Medical History:  Past Medical History:  Diagnosis Date  . Bradycardia    had cardiology workup in 2013:  "structurally normal heart"  . Food allergy    Tree Nuts  . Gastroesophageal reflux    no current med.  . Seasonal allergies   . Thumb fracture 04/2014   right    Past Surgical History:  Procedure Laterality Date  . ADENOIDECTOMY    . OPEN REDUCTION INTERNAL FIXATION (ORIF) METACARPAL Right 05/04/2014   Procedure: OPEN REDUCTION INTERNAL FIXATION (ORIF) RIGHT THUMB ;  Surgeon: Cindee Salt, MD;  Location: [facility];  Service: Orthopedics;  Laterality: Right;  .  PERCUTANEOUS PINNING  04/11/2011   Procedure: PERCUTANEOUS PINNING EXTREMITY;  Surgeon: Nicki Reaper, MD;  Location: [facility];  Service: Orthopedics;  Laterality: Right;  PERCUTANEOUS PINNING RIGHT LITTLE AND RING FINGERS  . TONSILLECTOMY  age 21 yrs.  . TYMPANOSTOMY TUBE PLACEMENT  as an infant   Family History:  Family History  Problem Relation Age of Onset  . Diabetes Maternal Grandfather   . Kidney disease Maternal Grandfather        not on  dialysis yet   Family Psychiatric  History: see H&P Social History:  Social History   Substance and Sexual Activity  Alcohol Use Yes     Social History   Substance and Sexual Activity  Drug Use Yes  . Types: Marijuana    Social History   Socioeconomic History  . Marital status: Single    Spouse name: Not on file  . Number of children: Not on file  . Years of education: Not on file  . Highest education level: Not on file  Occupational History  . Not on file  Tobacco Use  . Smoking status: Former Games developer  . Smokeless tobacco: Never Used  Substance and Sexual Activity  . Alcohol use: Yes  . Drug use: Yes    Types: Marijuana  . Sexual activity: Not on file  Other Topics Concern  . Not on file  Social History Narrative  . Not on file   Social Determinants of Health   Financial Resource Strain:   . Difficulty of Paying Living Expenses: Not on file  Food Insecurity:   . Worried About Programme researcher, broadcasting/film/video in the Last Year: Not on file  . Ran Out of Food in the Last Year: Not on file  Transportation Needs:   . Lack of Transportation (Medical): Not on file  . Lack of Transportation (Non-Medical): Not on file  Physical Activity:   . Days of Exercise per Week: Not on file  . Minutes of Exercise per Session: Not on file  Stress:   . Feeling of Stress : Not on file  Social Connections:   . Frequency of Communication with Friends and Family: Not on file  . Frequency of Social Gatherings with Friends and Family: Not on file  . Attends Religious Services: Not on file  . Active Member of Clubs or Organizations: Not on file  . Attends Banker Meetings: Not on file  . Marital Status: Not on file   Additional Social History:                         Sleep: Poor  Appetite:  Fair  Current Medications: Current Facility-Administered Medications  Medication Dose Route Frequency Provider Last Rate Last Admin  . acetaminophen (TYLENOL) tablet 650 mg  650  mg Oral Q6H PRN Patrcia Dolly, FNP      . alum & mag hydroxide-simeth (MAALOX/MYLANTA) 200-200-20 MG/5ML suspension 30 mL  30 mL Oral Q4H PRN Patrcia Dolly, FNP      . hydrOXYzine (ATARAX/VISTARIL) tablet 25 mg  25 mg Oral TID PRN Patrcia Dolly, FNP   25 mg at 10/20/19 0058  . ziprasidone (GEODON) injection 10 mg  10 mg Intramuscular Q12H PRN Jackelyn Poling, NP       And  . LORazepam (ATIVAN) injection 2 mg  2 mg Intramuscular Q12H PRN Nira Conn A, NP      . magnesium hydroxide (MILK OF MAGNESIA) suspension 30  mL  30 mL Oral Daily PRN Patrcia Dolly, FNP      . OLANZapine zydis (ZYPREXA) disintegrating tablet 5 mg  5 mg Oral BID Patrcia Dolly, FNP   5 mg at 10/20/19 0814  . traZODone (DESYREL) tablet 50 mg  50 mg Oral QHS PRN Jackelyn Poling, NP   50 mg at 10/20/19 0058    Lab Results:  Results for orders placed or performed during the hospital encounter of 10/18/19 (from the past 48 hour(s))  Comprehensive metabolic panel     Status: Abnormal   Collection Time: 10/18/19  2:48 PM  Result Value Ref Range   Sodium 139 135 - 145 mmol/L   Potassium 3.7 3.5 - 5.1 mmol/L   Chloride 104 98 - 111 mmol/L   CO2 27 22 - 32 mmol/L   Glucose, Bld 78 70 - 99 mg/dL    Comment: Glucose reference range applies only to samples taken after fasting for at least 8 hours.   BUN 12 6 - 20 mg/dL   Creatinine, Ser 1.61 0.61 - 1.24 mg/dL   Calcium 9.4 8.9 - 09.6 mg/dL   Total Protein 6.8 6.5 - 8.1 g/dL   Albumin 4.6 3.5 - 5.0 g/dL   AST 40 15 - 41 U/L   ALT 20 0 - 44 U/L   Alkaline Phosphatase 55 38 - 126 U/L   Total Bilirubin 1.8 (H) 0.3 - 1.2 mg/dL   GFR, Estimated >04 >54 mL/min   Anion gap 8 5 - 15    Comment: Performed at [facility], [address], [place], [place] [address]  cbc     Status: None   Collection Time: 10/18/19  2:48 PM  Result Value Ref Range   WBC 6.5 4.0 - 10.5 K/uL   RBC 4.60 4.22 - 5.81 MIL/uL   Hemoglobin 13.8 13.0 - 17.0 g/dL   HCT 91.4 39 - 52 %   MCV 93.5 80.0 - 100.0  fL   MCH 30.0 26.0 - 34.0 pg   MCHC 32.1 30.0 - 36.0 g/dL   RDW 78.2 95.6 - 21.3 %   Platelets 284 150 - 400 K/uL   nRBC 0.0 0.0 - 0.2 %    Comment: Performed at [facility], [address] [place], [place] [address]  Ethanol     Status: None   Collection Time: 10/18/19  2:49 PM  Result Value Ref Range   Alcohol, Ethyl (B) <10 <10 mg/dL    Comment: (NOTE) Lowest detectable limit for serum alcohol is 10 mg/dL.  For medical purposes only. Performed at [facility], [address], [place], [place] [address]   Salicylate level     Status: Abnormal   Collection Time: 10/18/19  2:49 PM  Result Value Ref Range   Salicylate Lvl <7.0 (L) 7.0 - 30.0 mg/dL    Comment: Performed at [facility], [address] [place], [place] [address]  Acetaminophen level     Status: Abnormal   Collection Time: 10/18/19  2:49 PM  Result Value Ref Range   Acetaminophen (Tylenol), Serum <10 (L) 10 - 30 ug/mL    Comment: (NOTE) Therapeutic concentrations vary significantly. A range of 10-30 ug/mL  may be an effective concentration for many patients. However, some  are best treated at concentrations outside of this range. Acetaminophen concentrations >150 ug/mL at 4 hours after ingestion  and >50 ug/mL at 12 hours after ingestion are often associated with  toxic reactions.  Performed at [facility], [address], [place], [place] [address]   Rapid urine drug screen (hospital performed)     Status: Abnormal   Collection Time: 10/18/19  4:03 PM  Result Value Ref Range   Opiates NONE DETECTED NONE DETECTED   Cocaine NONE DETECTED NONE DETECTED   Benzodiazepines NONE DETECTED NONE DETECTED   Amphetamines NONE DETECTED NONE DETECTED   Tetrahydrocannabinol POSITIVE (A) NONE DETECTED   Barbiturates NONE DETECTED NONE DETECTED    Comment: (NOTE) DRUG SCREEN FOR MEDICAL PURPOSES ONLY.  IF CONFIRMATION IS NEEDED FOR ANY PURPOSE, NOTIFY LAB WITHIN 5 DAYS.  LOWEST DETECTABLE  LIMITS FOR URINE DRUG SCREEN Drug Class                     Cutoff (ng/mL) Amphetamine and metabolites    1000 Barbiturate and metabolites    200 Benzodiazepine                 200 Tricyclics and metabolites     300 Opiates and metabolites        300 Cocaine and metabolites        300 THC                            50 Performed at [facility], [address] [place], [place] [address]   Respiratory Panel by RT PCR (Flu A&B, Covid) - Nasopharyngeal Swab     Status: None   Collection Time: 10/18/19  8:55 PM   Specimen: Nasopharyngeal Swab  Result Value Ref Range   SARS Coronavirus 2 by RT PCR NEGATIVE NEGATIVE    Comment: (NOTE) SARS-CoV-2 target nucleic acids are NOT DETECTED.  The SARS-CoV-2 RNA is generally detectable in upper respiratoy specimens during the acute phase of infection. The lowest concentration of SARS-CoV-2 viral copies this assay can detect is 131 copies/mL. A negative result does not preclude SARS-Cov-2 infection and should not be used as the sole basis for treatment or other patient management decisions. A negative result may occur with  improper specimen collection/handling, submission of specimen other than nasopharyngeal swab, presence of viral mutation(s) within the areas targeted by this assay, and inadequate number of viral copies (<131 copies/mL). A negative result must be combined with clinical observations, patient history, and epidemiological information. The expected result is Negative.  Fact Sheet for Patients:  https://www.moore.com/  Fact Sheet for Healthcare Providers:  https://www.young.biz/  This test is no t yet approved or cleared by the [place] FDA and  has been authorized for detection and/or diagnosis of SARS-CoV-2 by FDA under an Emergency Use Authorization (EUA). This EUA will remain  in effect (meaning this test can be used) for the duration of the COVID-19 declaration under  Section 564(b)(1) of the Act, 21 U.S.C. section 360bbb-3(b)(1), unless the authorization is terminated or revoked sooner.     Influenza A by PCR NEGATIVE NEGATIVE   Influenza B by PCR NEGATIVE NEGATIVE    Comment: (NOTE) The Xpert Xpress SARS-CoV-2/FLU/RSV assay is intended as an aid in  the diagnosis of influenza from Nasopharyngeal swab specimens and  should not be used as a sole basis for treatment. Nasal washings and  aspirates are unacceptable for Xpert Xpress SARS-CoV-2/FLU/RSV  testing.  Fact Sheet for Patients: https://www.moore.com/  Fact Sheet for Healthcare Providers: https://www.young.biz/  This test is not yet approved or cleared by the [place] and  has been authorized for detection and/or diagnosis of SARS-CoV-2 by  FDA under an Emergency Use Authorization (EUA). This EUA will remain  in effect (meaning this test can be used) for the duration of the  Covid-19 declaration under Section 564(b)(1) of the Act, 21  U.S.C. section 360bbb-3(b)(1), unless the authorization is  terminated or revoked. Performed at [facility], [address] [place], [place] [id number]     Blood Alcohol level:  Lab Results  Component Value Date   ETH <10 10/18/2019   ETH <10 10/16/2019    Metabolic Disorder Labs: No results found for: HGBA1C, MPG No results found for: PROLACTIN No results found for: CHOL, TRIG, HDL, CHOLHDL, VLDL, LDLCALC  Physical Findings: AIMS: Facial and Oral Movements Muscles of Facial Expression: None, normal Lips and Perioral Area: None, normal Jaw: None, normal Tongue: None, normal,Extremity Movements Upper (arms, wrists, hands, fingers): None, normal Lower (legs, knees, ankles, toes): None, normal, Trunk Movements Neck, shoulders, hips: None, normal, Overall Severity Severity of abnormal movements (highest score from questions above): None, normal Incapacitation due to abnormal movements: None,  normal Patient's awareness of abnormal movements (rate only patient's report): No Awareness, Dental Status Current problems with teeth and/or dentures?: No Does patient usually wear dentures?: No  CIWA:    COWS:     Musculoskeletal: Strength & Muscle Tone: within normal limits Gait & Station: normal Patient leans: N/A  Psychiatric Specialty Exam: Physical Exam Vitals and nursing note reviewed.  Constitutional:      General: He is not in acute distress.    Appearance: Normal appearance. He is not ill-appearing, toxic-appearing or diaphoretic.  HENT:     Head: Normocephalic and atraumatic.  Pulmonary:     Effort: Pulmonary effort is normal.  Neurological:     General: No focal deficit present.     Mental Status: He is alert and oriented to person, place, and time.     Review of Systems  Constitutional: Negative for activity change, appetite change, chills, fatigue and fever.  Respiratory: Negative.   Cardiovascular: Negative.   Gastrointestinal: Negative.   Neurological: Negative for dizziness, tremors, seizures, syncope, speech difficulty, weakness, light-headedness, numbness and headaches.  Psychiatric/Behavioral: Positive for behavioral problems, dysphoric mood and sleep disturbance. Negative for hallucinations and suicidal ideas. The patient is nervous/anxious.   Tingling in hands  Blood pressure 130/75, pulse (!) 48, temperature 98.5 F (36.9 C), temperature source Oral, resp. rate 20, height 5\' 10"  (1.778 m), weight 68 kg, SpO2 99 %.Body mass index is 21.51 kg/m.  General Appearance: Disheveled  Eye Contact:  Fair  Speech:  Clear and Coherent  Volume:  Normal  Mood:  Irritable  Affect:  Constricted  Thought Process:  Coherent  Orientation:  Full (Time, Place, and Person)  Thought Content:  Logical, Paranoia  Suicidal Thoughts:  No  Homicidal Thoughts:  No  Memory:  Immediate;   Poor Recent;   Poor Remote;   Poor  Judgement:  Impaired  Insight:  Lacking   Psychomotor Activity:  Increased  Concentration:  Concentration: Fair and Attention Span: Fair  Recall:  Poor  Fund of Knowledge:  Fair  Language:  Fair  Akathisia:  Negative  Handed:  Ambidextrous  AIMS (if indicated):     Assets:  Desire for Improvement Housing Social Support  ADL's:  Intact  Cognition:  WNL  Sleep:  Number of Hours: 1.5     Treatment Plan Summary:Pt is 19  year old male with negative past psychiatric history presented  [facility] on 10[facility]18/2019 for an assessment. Patient stated "I do not know why I am here." Patient requested that his father remain present during assessment. At initial assessment Patient's father,DavinLewis,reported increasingly bizarre behaviors x3 days. Today on Examination, He is lying on the bed, He has comb and a marker in his hands. He stated combing his hair and continued to comb throughout the interview. He had marks from a markers on his wrist and his hands. It appears that he wrote No 1 on his left hand. Pt appears disheveled, very irritable, paranoid and oriented x4. His speech is normal with normal volume. Pt's mood is euthymic with constricted affect.  No SI, HI or AVH. Paranoia present.  Blood pressure 130/75, pulse (!) 48, temperature 98.5 F (36.9 C), temperature source Oral, resp. rate 20, height 5\' 10"  (1.778 m), weight 68 kg, SpO2 99 %. No new labs. Dx- Substance induced Psychosis Vs. Acute Psychosis Plan- Daily contact with patient to assess and evaluate symptoms and progress in treatment -Monitor Vitals. -Monitor for medication side effects. -Continue Milk of Magnesia 30 ml PRN Daily for Constipation. -Continue Maalox/Mylanta 30 ml Q4H PRN for Indigestion. -Increase Zyprexa to 10  mg BID for Psychosis. -Start Hydroxyzine 25 mg TID PRN for Anxiety. -Start Trazodone 50 mg QHS PRN for sleep. -Patient will participate in the therapeutic group milieu.  , MD 10/20/2019, 10:32 AM

## 2019-10-20 NOTE — Progress Notes (Signed)
Pt is mildly confused. States that he is here because his vision got bad. When asked about the day of the week, he said Tuesday. When asked about the current president, he said Trump and then Duane Boston. He has been isolative to his room. Denies that he was doing any drugs prior to his admission. Encouraged pt to be active in the milieu and participate in groups. He did come out to get his vitals taken and did sit in the dayroom for a little bit. Pt denies SI/HI and AVH. Active listening, reassurance, and support provided. Medications administered as ordered by MD. Q 15 min safety checks continue. Pt's safety has been maintained.   10/19/19 2112  Psych Admission Type (Psych Patients Only)  Admission Status Involuntary  Psychosocial Assessment  Patient Complaints Anxiety;Confusion;Isolation;Substance abuse  Eye Contact Brief  Facial Expression Anxious;Pensive  Affect Anxious;Preoccupied  Speech Soft;Slow;Logical/coherent  Interaction Forwards little;Guarded;Minimal  Motor Activity Slow  Appearance/Hygiene In scrubs;Disheveled  Behavior Characteristics Cooperative;Anxious;Calm;Guarded  Mood Anxious;Preoccupied;Pleasant  Thought Administrator, sports thinking;Tangential  Content Preoccupation;Paranoia  Delusions Paranoid  Perception Derealization  Hallucination None reported or observed  Judgment Poor  Confusion Mild  Danger to Self  Current suicidal ideation? Denies  Danger to Others  Danger to Others None reported or observed

## 2019-10-20 NOTE — Progress Notes (Signed)
   10/20/19 2106  COVID-19 Daily Checkoff  Have you had a fever (temp > 37.80C/100F)  in the past 24 hours?  No  COVID-19 EXPOSURE  Have you traveled outside the state in the past 14 days? No  Have you been in contact with someone with a confirmed diagnosis of COVID-19 or PUI in the past 14 days without wearing appropriate PPE? No  Have you been living in the same home as a person with confirmed diagnosis of COVID-19 or a PUI (household contact)? No  Have you been diagnosed with COVID-19? No

## 2019-10-20 NOTE — Progress Notes (Signed)
Pt is resting in bed with his eyes closed. Pt appears to be sleeping.

## 2019-10-20 NOTE — Progress Notes (Signed)
When asked what he learned today, he responded by saying that he needs to keep his family closer to him. He also mentioned the importance of getting to know his peers better. His goal for tomorrow is to get discharged.

## 2019-10-20 NOTE — Progress Notes (Signed)
Pt is paranoid and irritable tonight. Pt was resting in bed with his eyes open at first earlier tonight and had his door closed. But then pt became irritable that the MHT was opening his door and had to check on him every 15 minutes. Pt complained that he was trying to sleep and didn't like that. This Clinical research associate then went to assess the situation. This writer attempted to open his door, but pt was pushing against it and wouldn't allow it. This Clinical research associate then explained to the pt of our importance on checking on him every 15 minutes and gave him the option to leave his rooms door open, but to open his bathroom door to block out light. Pt agreed and allowed this writer to do so. Pt has been fidgety and restless with odd behaviors. He has a gown along with socks laying on the other bed in his room as if it's resembling another person. NP, Nira Conn was notified. One time order of zyprexa 5 mg was administered at 0127 and ativan 1 mg at 0144. At this time, pt is awake with the lights on in his room. He refuses the need to turn them off to rest. He wanted to write, but pt has been encouraged to rest some. Reports that he slept well yesterday, documented sleep time is 2.5 hrs.

## 2019-10-21 ENCOUNTER — Encounter (HOSPITAL_COMMUNITY): Payer: Self-pay

## 2019-10-21 DIAGNOSIS — F29 Unspecified psychosis not due to a substance or known physiological condition: Secondary | ICD-10-CM

## 2019-10-21 MED ORDER — TRAZODONE HCL 50 MG PO TABS
50.0000 mg | ORAL_TABLET | Freq: Once | ORAL | Status: AC
  Administered 2019-10-21: 50 mg via ORAL
  Filled 2019-10-21 (×2): qty 1

## 2019-10-21 MED ORDER — OLANZAPINE 5 MG PO TBDP
5.0000 mg | ORAL_TABLET | Freq: Once | ORAL | Status: AC
  Administered 2019-10-21: 5 mg via ORAL
  Filled 2019-10-21 (×2): qty 1

## 2019-10-21 MED ORDER — NICOTINE POLACRILEX 2 MG MT GUM: 2.0000 mg | CHEWING_GUM | OROMUCOSAL | Status: DC | PRN

## 2019-10-21 MED ORDER — BENZOCAINE 10 % MT GEL
Freq: Three times a day (TID) | OROMUCOSAL | Status: DC | PRN
  Filled 2019-10-21: qty 9

## 2019-10-21 MED ORDER — RISPERIDONE 1 MG PO TBDP
3.0000 mg | ORAL_TABLET | Freq: Two times a day (BID) | ORAL | Status: DC
  Administered 2019-10-21 – 2019-10-22 (×2): 3 mg via ORAL
  Filled 2019-10-21 (×4): qty 3

## 2019-10-21 MED ORDER — NICOTINE 21 MG/24HR TD PT24
21.0000 mg | MEDICATED_PATCH | Freq: Every day | TRANSDERMAL | Status: DC
  Administered 2019-10-21 – 2019-10-23 (×3): 21 mg via TRANSDERMAL
  Filled 2019-10-21 (×6): qty 1

## 2019-10-21 NOTE — Progress Notes (Signed)
[facility] MD Progress Note  10/21/2019 12:41 PM Kenneth Griffith  MRN:  [id number] Subjective:  "I feel better"  Chart reviewed. Pt received 5 mg zyprexa as one time dose at 350 am. Patient interviewed this AM  Bedside. He is calm and cooperative, remains focused on discharge, remains bizarre at times. He states that overall since he has been in the hospital that he is feeling better. He describes his mood as "changed for the better" which he attributes to sleeping better. He also states that his thoughts have "slowed down" which he finds is a positive thing. He denies SI/HI/AVH. He states that he has been attending groups and had a chance to speak with his father today; he states that they discussed his hospitalization and indicates that his father noticed an improvement.  He continues to be somewhat bizarre, pulls multiple papers out if pockets and says that he has been keeping notes and that  they are "notes on life".   Spoke with SWer who states that patient was bizarre in group today and discussed being able to communicate telepathically.    Collateral Kenneth (Father) at [phone] @ 12:49 PM Called father with update, thinks he is about the same. When he spoke to him last night he seemed calm. When he calls mom its about arguments that they have had or problems with step dad. Does not think he is near himself. Kalid told mom that he thinks the marijuana he smoked might have had glass cleaner in it. Mom started sending him things about it but patient denied and that "it was to get her off my back". "he's slightly better".   Kenneth Griffith (mother)  [phone] @ 1:10 PM  Spoke to Alafaya on Wednesday before the party and that "he was fine and normal" to tell him that they leaving town from Friday to Tuesday. Then she called him Thursday, seemed not totally normal, only gave short resonses, only said yes or no. She asked him if something was wrong and he said that he was with someone but that he couldn't tell  her their name. She noticed something was off but did not press the issue. Then on Friday AM got a notification about him turning off the alarm to get into the house and he was saying "crazy things". She called his father and asked him to go to the house because something is wrong. York Spaniel he got worse after he left the [place] [facility]. "Something is still off", doesn't remember things. First somewhat normal conversation was yesterday. Today was the first day not requesting to leave.  Thinks he is sounding better than when he first got to the hospital. Requests that team speak to patient about rehab as it is felt that rehab would be needed to be apart of a safe discharge plan.      Principal Problem: Psychosis (HCC) Diagnosis: Principal Problem:   Psychosis (HCC)  Total Time spent with patient: 20 minutes  Past Psychiatric History: see H&P  Past Medical History:  Past Medical History:  Diagnosis Date  . Bradycardia    had cardiology workup in 2013:  "structurally normal heart"  . Food allergy    Tree Nuts  . Gastroesophageal reflux    no current med.  . Seasonal allergies   . Thumb fracture 04/2014   right    Past Surgical History:  Procedure Laterality Date  . ADENOIDECTOMY    . OPEN REDUCTION INTERNAL FIXATION (ORIF) METACARPAL Right 05/04/2014   Procedure: OPEN REDUCTION  INTERNAL FIXATION (ORIF) RIGHT THUMB ;  Surgeon: Cindee Salt, MD;  Location: [facility];  Service: Orthopedics;  Laterality: Right;  . PERCUTANEOUS PINNING  04/11/2011   Procedure: PERCUTANEOUS PINNING EXTREMITY;  Surgeon: Nicki Reaper, MD;  Location: [facility];  Service: Orthopedics;  Laterality: Right;  PERCUTANEOUS PINNING RIGHT LITTLE AND RING FINGERS  . TONSILLECTOMY  age 63 yrs.  . TYMPANOSTOMY TUBE PLACEMENT  as an infant   Family History:  Family History  Problem Relation Age of Onset  . Diabetes Maternal Grandfather   . Kidney disease Maternal Grandfather        not on dialysis  yet   Family Psychiatric  History: see H&P Social History:  Social History   Substance and Sexual Activity  Alcohol Use Yes     Social History   Substance and Sexual Activity  Drug Use Yes  . Types: Marijuana    Social History   Socioeconomic History  . Marital status: Single    Spouse name: Not on file  . Number of children: Not on file  . Years of education: Not on file  . Highest education level: Not on file  Occupational History  . Not on file  Tobacco Use  . Smoking status: Former Games developer  . Smokeless tobacco: Never Used  Vaping Use  . Vaping Use: Former  Substance and Sexual Activity  . Alcohol use: Yes  . Drug use: Yes    Types: Marijuana  . Sexual activity: Not on file  Other Topics Concern  . Not on file  Social History Narrative  . Not on file   Social Determinants of Health   Financial Resource Strain:   . Difficulty of Paying Living Expenses: Not on file  Food Insecurity:   . Worried About Programme researcher, broadcasting/film/video in the Last Year: Not on file  . Ran Out of Food in the Last Year: Not on file  Transportation Needs:   . Lack of Transportation (Medical): Not on file  . Lack of Transportation (Non-Medical): Not on file  Physical Activity:   . Days of Exercise per Week: Not on file  . Minutes of Exercise per Session: Not on file  Stress:   . Feeling of Stress : Not on file  Social Connections:   . Frequency of Communication with Friends and Family: Not on file  . Frequency of Social Gatherings with Friends and Family: Not on file  . Attends Religious Services: Not on file  . Active Member of Clubs or Organizations: Not on file  . Attends Banker Meetings: Not on file  . Marital Status: Not on file   Additional Social History:         see h and p                Sleep: Fair  Appetite:  Fair  Current Medications: Current Facility-Administered Medications  Medication Dose Route Frequency Provider Last Rate Last Admin  .  acetaminophen (TYLENOL) tablet 650 mg  650 mg Oral Q6H PRN Patrcia Dolly, FNP   650 mg at 10/21/19 0750  . alum & mag hydroxide-simeth (MAALOX/MYLANTA) 200-200-20 MG/5ML suspension 30 mL  30 mL Oral Q4H PRN Patrcia Dolly, FNP      . hydrOXYzine (ATARAX/VISTARIL) tablet 25 mg  25 mg Oral TID PRN Patrcia Dolly, FNP   25 mg at 10/20/19 2106  . ziprasidone (GEODON) injection 10 mg  10 mg Intramuscular Q12H PRN Nira Conn  A, NP       And  . LORazepam (ATIVAN) injection 2 mg  2 mg Intramuscular Q12H PRN Nira Conn A, NP      . magnesium hydroxide (MILK OF MAGNESIA) suspension 30 mL  30 mL Oral Daily PRN Patrcia Dolly, FNP      . nicotine polacrilex (NICORETTE) gum 2 mg  2 mg Oral PRN Nira Conn A, NP      . OLANZapine zydis (ZYPREXA) disintegrating tablet 10 mg  10 mg Oral BID Karsten Ro, MD   10 mg at 10/21/19 0755  . traZODone (DESYREL) tablet 50 mg  50 mg Oral QHS PRN Nira Conn A, NP   50 mg at 10/20/19 2106    Lab Results: No results found for this or any previous visit (from the past 48 hour(s)).  Blood Alcohol level:  Lab Results  Component Value Date   ETH <10 10/18/2019   ETH <10 10/16/2019    Metabolic Disorder Labs: No results found for: HGBA1C, MPG No results found for: PROLACTIN No results found for: CHOL, TRIG, HDL, CHOLHDL, VLDL, LDLCALC  Physical Findings: AIMS: Facial and Oral Movements Muscles of Facial Expression: None, normal Lips and Perioral Area: None, normal Jaw: None, normal Tongue: None, normal,Extremity Movements Upper (arms, wrists, hands, fingers): None, normal Lower (legs, knees, ankles, toes): None, normal, Trunk Movements Neck, shoulders, hips: None, normal, Overall Severity Severity of abnormal movements (highest score from questions above): None, normal Incapacitation due to abnormal movements: None, normal Patient's awareness of abnormal movements (rate only patient's report): No Awareness, Dental Status Current problems with teeth and/or  dentures?: No Does patient usually wear dentures?: No  CIWA:    COWS:     Musculoskeletal: Strength & Muscle Tone: within normal limits Gait & Station: normal Patient leans: N/A  Psychiatric Specialty Exam: Physical Exam Constitutional:      Appearance: Normal appearance.  HENT:     Head: Normocephalic and atraumatic.  Pulmonary:     Effort: Pulmonary effort is normal.  Musculoskeletal:     Cervical back: Normal range of motion.  Neurological:     Mental Status: He is alert.     Review of Systems  Constitutional: Negative for chills, fatigue and fever.  Respiratory: Negative for shortness of breath.   Cardiovascular: Negative for chest pain.  Gastrointestinal: Negative for abdominal pain.  Neurological: Negative for facial asymmetry.    Blood pressure 126/65, pulse (!) 138, temperature (!) 97.5 F (36.4 C), temperature source Oral, resp. rate 16, height 5\' 10"  (1.778 m), weight 68 kg, SpO2 99 %.Body mass index is 21.51 kg/m.  General Appearance: Casual and Fairly Groomed  Eye Contact:  Fair  Speech:  Clear and Coherent, Normal Rate and    Volume:  Normal  Mood:  "changed for the better"  Affect:  Constricted, euthymic  Thought Process:  Coherent and Linear mostly, tangential at times  Orientation:  Full (Time, Place, and Person)  Thought Content:  delusions regarding telepathy  Suicidal Thoughts:  No  Homicidal Thoughts:  No  Memory:  Immediate;   Fair Recent;   Poor  Judgement:  Impaired  Insight:  Lacking  Psychomotor Activity:  Normal  Concentration:  Concentration: Fair  Recall:  impaired  Fund of Knowledge:  Fair  Language:  Fair  Akathisia:  No  Handed:  Right  AIMS (if indicated):     Assets:  Housing Leisure Time Physical Health Resilience Social Support  ADL's:  Intact  Cognition:  WNL  Sleep:  Number of Hours: 2.5     Treatment Plan Summary: Daily contact with patient to assess and evaluate symptoms and progress in treatment and  Medication management    Pt is[age]year old male with negative past psychiatric historypresented to [facility] 10[facility]18/2019 for an assessment.Patient stated"I do not know why I am here." Patient requestedthat his father remain present during assessment. At initial assessmentPatient's father,DavinLewis,reportedincreasingly bizarre behaviors x3 days.  Today on Examination, he remains bizarre, grandiose and has difficulty participating in group d/t ongoing psychosis.   Pt received 5 mg zyprexa at 305 am d/t agitation. Increased zyprexa dose is not adequately controlling sx and necessitating need for additional medication.  Will change antipsychotic from zyprexa to risperdal.    Plan- Daily contact with patient to assess and evaluate symptoms and progress in treatment -Monitor Vitals. -Monitor for medication side effects. -Continue Milk of Magnesia 30 ml PRN Daily for Constipation. -Continue Maalox/Mylanta 30 ml Q4H PRN for Indigestion. -stop Zyprexa to 10  mg BIDfor Psychosis. -Start risperdal 3 mg BID for psychosis -Start Hydroxyzine 25 mg TID PRN for Anxiety. -Start Trazodone 50 mg QHS PRN for sleep. -Patient will participate in the therapeutic group milieu.   Estella HuskKatherine S Areeba Sulser, MD 10/21/2019, 12:41 PM

## 2019-10-21 NOTE — [facility] Group Notes (Signed)
[facility] LCSW Group Therapy  10/21/2019 2:06 PM  Type of Therapy and Topic: Group Therapy: Healthy and Unhealthy Supports  Participation Level: Moderate  Description of Group: Patients in this group were introduced to the idea of adding a variety of healthy supports to address the various needs in their lives. Patients discussed what additional healthy supports could be helpful in their recovery and wellness after discharge in order to prevent future hospitalizations. An emphasis was placed on using counselor, doctor, therapy groups, 12-step groups, and problem-specific support groups to expand supports. They also worked as a group on developing a specific plan for several patients to deal with unhealthy supports through boundary-setting, psychoeducation with loved ones, and even termination of relationships.  Therapeutic Goals:  1) discuss importance of adding supports to stay well once out of the hospital  2) compare healthy versus unhealthy supports and identify some examples of each  3) generate ideas and descriptions of healthy supports that can be added  4) offer mutual support about how to address unhealthy supports  5) encourage active participation in and adherence to discharge plan  Summary of Patient Progress:Pt participated in the group but statements made were not related to the group and also did not make sense. When asked characteristics of healthy relationships the pt stated "if we were all family and set in a room together we would all be able to talk telepathically.". Pt also began to rant about his picture he drew but sentences were not cohesive or related to the topic.    Therapeutic Modalities:  Motivational Interviewing  Brief Solution-Focused Therapy  Felizardo Hoffmann 10/21/2019, 2:06 PM

## 2019-10-21 NOTE — Progress Notes (Signed)
Pt was more active in the milieu tonight. He was interacting with his peers and he attended group. 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 Pt denies SI/HI and AVH (he answered these questions before writer was even done asking). Active listening, reassurance, and support provided. Medications administered as ordered by MD. Q 15 min safety checks continue. Pt's safety has been maintained.   10/20/19 2106  Psych Admission Type (Psych Patients Only)  Admission Status Involuntary  Psychosocial Assessment  Patient Complaints Anxiety;Substance abuse  Eye Contact Fair  Facial Expression Animated  Affect Constricted  Speech Logical/coherent  Interaction Cautious;Forwards little;Minimal  Motor Activity Slow  Appearance/Hygiene Improved  Behavior Characteristics Cooperative;Fidgety;Anxious;Calm  Mood Anxious;Preoccupied  Thought Administrator, sports thinking  Content Paranoia  Delusions Grandeur;Paranoid  Perception Derealization  Hallucination None reported or observed  Judgment Poor  Confusion Mild  Danger to Self  Current suicidal ideation? Denies  Danger to Others  Danger to Others None reported or observed

## 2019-10-21 NOTE — Plan of Care (Signed)
Nurse discussed anxiety, depression and coping skills with patient.  

## 2019-10-21 NOTE — Progress Notes (Signed)
D:  Patient's self inventory sheet, patient sleeps good, sleep medication helpful.  Fair appetite, high energy level, good concentration.  Rated depression 1, hopeless 2, anxiety 3.  Withdrawals, chilling, cloudiness.  Denied SI.  Then checked sometimes.  Physical problems, pain, stomach and back, worst pain #5 in past 24 hours.  Pain medication helpful.  Goal brighten other people's day and night. Plans to be himself.  "You guys run this place so well  I admire yall."  Does have discharge plans.  "No one throws me off the right path of life." A:  Medications administered per MD orders.  Emotional support and encouragement given patient. R:  Patient denied SI and HI, contracts for safety.  Denied AV hallucinations.  Safety maintained with 15 minute checks.

## 2019-10-21 NOTE — Progress Notes (Signed)
[facility] Group Notes:  (Nursing/MHT/Case Management/Adjunct)  Date:  10/21/2019  Time:  2030  Type of Therapy:  wrap up group  Participation Level:  Active  Participation Quality:  Appropriate, Attentive, Sharing and Supportive  Affect:  Anxious  Cognitive:  Alert  Insight:  Improving  Engagement in Group:  Engaged  Modes of Intervention:  Clarification, Education and Support  Summary of Progress/Problems: Positive thinking and positive change were discussed. Pt shared he planned to be a better man. A man who is trustworthy, reliable, and dependable. Pt is grateful for life itself.   Marcille Buffy 10/21/2019, 9:39 PM

## 2019-10-21 NOTE — Progress Notes (Signed)
Recreation Therapy Notes  Date:  10.13.21 Time: 0930 Location: 300 Group Room  Group Topic: Stress Management  Goal Area(s) Addresses:  Patient will identify positive stress management techniques. Patient will identify benefits of using stress management post d/c.  Intervention: Stress Management  Activity: Guided Imagery.  LRT read a script that led patients on a journey to envision their peaceful place.  Patients were imagine all the things that made their place peaceful to them.  Education:  Stress Management, Discharge Planning.   Education Outcome: Acknowledges Education  Clinical Observations/Feedback:  Pt did not attend activity.    Caroll Rancher, LRT/CTRS         Lillia Abed, Marvetta Vohs A 10/21/2019 10:14 AM

## 2019-10-22 MED ORDER — RISPERIDONE 2 MG PO TBDP
4.0000 mg | ORAL_TABLET | Freq: Every day | ORAL | Status: DC
  Administered 2019-10-22: 4 mg via ORAL
  Filled 2019-10-22 (×4): qty 2

## 2019-10-22 MED ORDER — LORAZEPAM 1 MG PO TABS
1.0000 mg | ORAL_TABLET | Freq: Once | ORAL | Status: AC
  Administered 2019-10-22: 1 mg via ORAL
  Filled 2019-10-22: qty 1

## 2019-10-22 MED ORDER — PANTOPRAZOLE SODIUM 20 MG PO TBEC
20.0000 mg | DELAYED_RELEASE_TABLET | Freq: Every day | ORAL | Status: DC
  Administered 2019-10-22 – 2019-10-23 (×2): 20 mg via ORAL
  Filled 2019-10-22 (×5): qty 1

## 2019-10-22 MED ORDER — RISPERIDONE 2 MG PO TBDP
2.0000 mg | ORAL_TABLET | Freq: Every day | ORAL | Status: DC
  Administered 2019-10-23: 2 mg via ORAL
  Filled 2019-10-22 (×3): qty 1

## 2019-10-22 NOTE — [facility] Counselor (Signed)
CSW approach pt and asked if he would be interested in residential treatment on 10/22/2019 at 2:30pm. Before CSW could finish asking, pt cut her off and stated NO very sternly.   Fredirick Lathe, LCSWA Clinicial Social Worker [employer]

## 2019-10-22 NOTE — Progress Notes (Addendum)
[facility] MD Progress Note  10/22/2019 12:08 PM Kenneth Griffith  MRN:  [id number] Subjective:  Pt is seen and examined today. Pt didn't sleep well last night. Pt states he is feeling good. He rates his mood at 8/10 (10 being the best mood). He states sometimes he feels sleepy during the day. He states he was really anxious yesterday night and was turning the lights off and on. He talked to his father recently and they talked about random things. He states he is planning to get a new dog. He states he had nightmares yesterday night. He complains of day time sleepiness. We talked about abstinence from drugs, Pt states he would like to stop it over the course of 1 year and not cold [place]. He is advised not to use any drugs and if he wants to stop it completely he can ask outpatient provider for medications that can help him deal with withdrawal effects. He states he is not interested in residential facility for drug addiction at this time. Currently,Pt denies any suicidal ideation, homicidal ideation and, visual and auditory hallucination.  Pt denies any headache, nausea, vomiting, dizziness, chest pain, SOB, abdominal pain, diarrhea, and constipation. Pt denies any other concerns. Objective: Pt is19year old male with negative past psychiatric historypresented to [facility] 10[facility]18/2019 for an assessment.Patient stated"I do not know why I am here." Patient requestedthat his father remain present during assessment. At initial assessmentPatient's father,DavinLewis,reportedincreasingly bizarre behaviors x 3 days. Today on Examination, He is lying on the bed. He seems much clearer today. He doesn't seem as bizzare today. Ptappears casual. He is not irritable today. He is not paranoid, oriented x4. His speech is normal withnormalvolume. Pt's mood iseuthymicwith constrictedaffect. No SI, HI or AVH.  Nursing report states Pt slept 1.5 hours only last night. Nursing notes indicate that Pt was  restless and fidgety at 4 am this morning. He was asking to get his laundry done and then for coloring pencils. Pt had been coloring in his room. Reported to nurse that he is full of energy and that when he gets up twice during the night, he has trouble falling back asleep. He attended groups. He doesn't seem as bizarre tonight. He wasn't isolative to his room tonight. This morning, Nurse reported that he is complaining of acid reflux.  Blood pressure 130/77, pulse (!) 112, temperature 97.7 F (36.5 C), temperature source Oral, resp. rate 16, height 5\' 10"  (1.778 m), weight 68 kg, SpO2 100 %. Phone conversation with @[phone] -Carolynn Comment- She states she talked to him recently and he sounded much better but he asked her about his hunting gun. She said she wants him to go to a rehab program. Mother was informed that provider spoke to him in the morning about stopping drugs and option to go to rehab program but he declined it. Mom was informed that we cannot force him to go to a rehab program. Mom was encouraged to speak to him about going to rehab and try to convince him. Mom said "she and his father would talk to him".   Principal Problem: Psychosis (HCC) Diagnosis: Principal Problem:   Psychosis (HCC)  Total Time spent with patient: 20 minutes  Past Psychiatric History: See H&P  Past Medical History:  Past Medical History:  Diagnosis Date  . Bradycardia    had cardiology workup in 2013:  "structurally normal heart"  . Food allergy    Tree Nuts  . Gastroesophageal reflux    no current med.  [phone]  Seasonal allergies   . Thumb fracture 04/2014   right    Past Surgical History:  Procedure Laterality Date  . ADENOIDECTOMY    . OPEN REDUCTION INTERNAL FIXATION (ORIF) METACARPAL Right 05/04/2014   Procedure: OPEN REDUCTION INTERNAL FIXATION (ORIF) RIGHT THUMB ;  Surgeon: Cindee Salt, MD;  Location: [facility];  Service: Orthopedics;  Laterality: Right;  . PERCUTANEOUS  PINNING  04/11/2011   Procedure: PERCUTANEOUS PINNING EXTREMITY;  Surgeon: Nicki Reaper, MD;  Location:  SURGERY CENTER;  Service: Orthopedics;  Laterality: Right;  PERCUTANEOUS PINNING RIGHT LITTLE AND RING FINGERS  . TONSILLECTOMY  age 63 yrs.  . TYMPANOSTOMY TUBE PLACEMENT  as an infant   Family History:  Family History  Problem Relation Age of Onset  . Diabetes Maternal Grandfather   . Kidney disease Maternal Grandfather        not on dialysis yet   Family Psychiatric  History: see H&P Social History:  Social History   Substance and Sexual Activity  Alcohol Use Yes     Social History   Substance and Sexual Activity  Drug Use Yes  . Types: Marijuana    Social History   Socioeconomic History  . Marital status: Single    Spouse name: Not on file  . Number of children: Not on file  . Years of education: Not on file  . Highest education level: Not on file  Occupational History  . Not on file  Tobacco Use  . Smoking status: Former Games developer  . Smokeless tobacco: Never Used  Vaping Use  . Vaping Use: Former  Substance and Sexual Activity  . Alcohol use: Yes  . Drug use: Yes    Types: Marijuana  . Sexual activity: Not on file  Other Topics Concern  . Not on file  Social History Narrative  . Not on file   Social Determinants of Health   Financial Resource Strain:   . Difficulty of Paying Living Expenses: Not on file  Food Insecurity:   . Worried About Programme researcher, broadcasting/film/video in the Last Year: Not on file  . Ran Out of Food in the Last Year: Not on file  Transportation Needs:   . Lack of Transportation (Medical): Not on file  . Lack of Transportation (Non-Medical): Not on file  Physical Activity:   . Days of Exercise per Week: Not on file  . Minutes of Exercise per Session: Not on file  Stress:   . Feeling of Stress : Not on file  Social Connections:   . Frequency of Communication with Friends and Family: Not on file  . Frequency of Social Gatherings with  Friends and Family: Not on file  . Attends Religious Services: Not on file  . Active Member of Clubs or Organizations: Not on file  . Attends Banker Meetings: Not on file  . Marital Status: Not on file   Additional Social History:                         Sleep: Poor  Appetite:  Good  Current Medications: Current Facility-Administered Medications  Medication Dose Route Frequency Provider Last Rate Last Admin  . acetaminophen (TYLENOL) tablet 650 mg  650 mg Oral Q6H PRN Patrcia Dolly, FNP   650 mg at 10/21/19 0750  . alum & mag hydroxide-simeth (MAALOX/MYLANTA) 200-200-20 MG/5ML suspension 30 mL  30 mL Oral Q4H PRN Patrcia Dolly, FNP      .  benzocaine (ORAJEL) 10 % mucosal gel   Mouth/Throat TID PRN Estella Husk, MD   Given at 10/21/19 1707  . hydrOXYzine (ATARAX/VISTARIL) tablet 25 mg  25 mg Oral TID PRN Patrcia Dolly, FNP   25 mg at 10/21/19 2139  . ziprasidone (GEODON) injection 10 mg  10 mg Intramuscular Q12H PRN Jackelyn Poling, NP       And  . LORazepam (ATIVAN) injection 2 mg  2 mg Intramuscular Q12H PRN Nira Conn A, NP      . magnesium hydroxide (MILK OF MAGNESIA) suspension 30 mL  30 mL Oral Daily PRN Patrcia Dolly, FNP      . nicotine (NICODERM CQ - dosed in mg/24 hours) patch 21 mg  21 mg Transdermal Daily Estella Husk, MD   21 mg at 10/22/19 0744  . nicotine polacrilex (NICORETTE) gum 2 mg  2 mg Oral PRN Nira Conn A, NP      . pantoprazole (PROTONIX) EC tablet 20 mg  20 mg Oral Daily Grayling Schranz, MD      . risperiDONE (RISPERDAL M-TABS) disintegrating tablet 3 mg  3 mg Oral BID Estella Husk, MD   3 mg at 10/22/19 0744  . traZODone (DESYREL) tablet 50 mg  50 mg Oral QHS PRN Nira Conn A, NP   50 mg at 10/22/19 0037    Lab Results: No results found for this or any previous visit (from the past 48 hour(s)).  Blood Alcohol level:  Lab Results  Component Value Date   ETH <10 10/18/2019   ETH <10 10/16/2019    Metabolic  Disorder Labs: No results found for: HGBA1C, MPG No results found for: PROLACTIN No results found for: CHOL, TRIG, HDL, CHOLHDL, VLDL, LDLCALC  Physical Findings: AIMS: Facial and Oral Movements Muscles of Facial Expression: None, normal Lips and Perioral Area: None, normal Jaw: None, normal Tongue: None, normal,Extremity Movements Upper (arms, wrists, hands, fingers): None, normal Lower (legs, knees, ankles, toes): None, normal, Trunk Movements Neck, shoulders, hips: None, normal, Overall Severity Severity of abnormal movements (highest score from questions above): None, normal Incapacitation due to abnormal movements: None, normal Patient's awareness of abnormal movements (rate only patient's report): No Awareness, Dental Status Current problems with teeth and/or dentures?: No Does patient usually wear dentures?: No  CIWA:    COWS:     Musculoskeletal: Strength & Muscle Tone: within normal limits Gait & Station: normal Patient leans: N/A  Psychiatric Specialty Exam: Physical Exam Vitals and nursing note reviewed.  Constitutional:      General: He is not in acute distress.    Appearance: Normal appearance. He is not ill-appearing, toxic-appearing or diaphoretic.  HENT:     Head: Normocephalic and atraumatic.  Pulmonary:     Effort: Pulmonary effort is normal.  Neurological:     General: No focal deficit present.     Mental Status: He is alert and oriented to person, place, and time.     Review of Systems  Constitutional: Positive for fatigue. Negative for activity change, appetite change, chills, diaphoresis and fever.  HENT: Negative.   Respiratory: Negative.   Cardiovascular: Negative.   Genitourinary: Negative.   Neurological: Negative.   Psychiatric/Behavioral: Positive for sleep disturbance. Negative for dysphoric mood and hallucinations. The patient is nervous/anxious. The patient is not hyperactive.     Blood pressure 130/77, pulse (!) 112, temperature 97.7  F (36.5 C), temperature source Oral, resp. rate 16, height 5\' 10"  (1.778 m), weight 68 kg, SpO2  100 %.Body mass index is 21.51 kg/m.  General Appearance: Casual  Eye Contact:  Fair  Speech:  Clear and Coherent  Volume:  Normal  Mood:  Euthymic "Good"  Affect:  Constricted  Thought Process:  Coherent and Linear  Orientation:  Full (Time, Place, and Person)  Thought Content:  Logical  Suicidal Thoughts:  No  Homicidal Thoughts:  No  Memory:  Immediate;   Fair Recent;   Poor  Judgement:  Impaired  Insight:  Lacking  Psychomotor Activity:  Normal  Concentration:  Concentration: Fair and Attention Span: Fair  Recall:  Poor  Fund of Knowledge:  Fair  Language:  Fair  Akathisia:  Negative  Handed:  Right  AIMS (if indicated):     Assets:  Housing Leisure Time Resilience Social Support  ADL's:  Intact  Cognition:  WNL  Sleep:  Number of Hours: 1.5     Treatment Plan Summary:Pt is[age]year old male with negative past psychiatric historypresented to [facility] 10/18/2019 for an assessment.Patient stated"I do not know why I am here." Patient requestedthat his father remain present during assessment. At initial assessmentPatient's father,DavinLewis,reportedincreasingly bizarre behaviors x 3 days. Today on Examination, He is lying on the bed. He seems much clearer today. He doesn't seem as bizzare today. Ptappears casual. He is not irritable today. He is not paranoid, oriented x4. His speech is normal withnormalvolume. Pt's mood iseuthymicwith constrictedaffect. No SI, HI or AVH.  Blood pressure 130/77, pulse (!) 112, temperature 97.7 F (36.5 C), temperature source Oral, resp. rate 16, height 5\' 10"  (1.778 m), weight 68 kg, SpO2 100 %.  DX- Acute Psychosis Vs Substance induced psychosis.  Pertinent findings today  -Not bizarre behaviors. -Not irritable. -Feels sedated during day time. -C/o Acid reflux -Still not sleeping well.  Plan- Daily contact  with patient to assess and evaluate symptoms and progress in treatment -Monitor Vitals. -Monitor for medication side effects. -Continue Milk of Magnesia 30 ml PRN Daily for Constipation. -Continue Maalox/Mylanta 30 ml Q4H PRN for Indigestion. -Start Protonix 40 mg Daily. -Nicotine Gum /patch for Nicotine dependence -Continue Orajel 10% mucosal gel 3 times PRN. -Change Risperdal to 2 mg daily from tomorrow and 4 mg nightly from tonight for psychosis. -Continue Hydroxyzine 25 mg TID PRN for Anxiety. -Continue Trazodone 50 mg QHS PRN for sleep. -Patient will participate in the therapeutic group milieu.  Karsten RoVandana  Andi Mahaffy, MD 10/22/2019, 12:08 PM

## 2019-10-22 NOTE — Progress Notes (Signed)
Pt doesn't seem as bizarre tonight. He was in the dayroom coloring, watching television, and he did attend group. He wasn't isolative to his room tonight. His thoughts seem a little clearer. He said that he is "thankful for life" because he knows people that stayed in his neighborhood before that randomly died. 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 He shares how he grieved. He said that he wept for several hours but then did realize that life still goes on. He was able to move past the situation, but does endorse flashbacks of it sometimes. Pt denies SI/HI and AVH. Active listening, reassurance, and support provided. Medications administered as ordered by MD. Q 15 min safety checks continue. Pt's safety has been maintained.   10/21/19 2139  Psych Admission Type (Psych Patients Only)  Admission Status Involuntary  Psychosocial Assessment  Patient Complaints Anxiety;Depression  Eye Contact Avoids  Facial Expression Animated;Anxious  Affect Anxious  Speech Logical/coherent  Interaction Assertive  Motor Activity Other (Comment) (WDL)  Appearance/Hygiene Unremarkable  Behavior Characteristics Anxious;Fidgety  Mood Depressed;Anxious;Pleasant  Thought Process  Coherency WDL  Content Paranoia  Delusions Paranoid;Grandeur  Perception WDL  Hallucination None reported or observed  Judgment Poor  Confusion None  Danger to Self  Current suicidal ideation? Denies  Danger to Others  Danger to Others None reported or observed

## 2019-10-22 NOTE — Progress Notes (Signed)
Pt is restless and fidgety. Asking to get his laundry done and then for coloring pencils. Pt has been coloring in his room. Reports that he is full of energy and that when he gets up twice during the night, he has trouble falling back asleep. NP, Nira Conn notified. One time order for 1 mg of ativan obtained and administered. Pt encouraged to turn his lights off and lay in bed.

## 2019-10-22 NOTE — BHH Group Notes (Signed)
Occupational Therapy Group Note Date: 10/22/2019 Group Topic/Focus: Coping Skills and Brain Fitness  Group Description: Group encouraged increased social engagement and participation through discussion/activity focused on brain fitness. Patients were provided education on various brain fitness activities/strategies, with explanation provided on the qualifying factors including: one, that is has to be challenging/hard and two, it has to be something that you do not do every day. Patients engaged actively during group session in various brain fitness activities to increase attention, concentration, and problem-solving skills. Discussion followed with a focus on identifying the benefits of brain fitness activities as use for adaptive coping strategies and distraction.    Therapeutic Goal(s): Identify benefit(s) of brain fitness activities as use for adaptive coping and healthy distraction. Identify specific brain fitness activities to engage in as use for adaptive coping and healthy distraction.  Participation Level: Active   Participation Quality: Independent   Behavior: Cooperative and Interactive   Speech/Thought Process: Focused   Affect/Mood: Constricted   Insight: Fair   Judgement: Fair   Individualization: Itai joined after group started, 10 minutes or so, however was able to jump right into activity and engage appropriately. Engaged in discussion with min cues from this Clinical research associate. Noted benefit of engaging in brain fitness activities and other distractions to promote adaptive coping.   Modes of Intervention: Activity, Discussion, Education and Problem-solving  Patient Response to Interventions:  Attentive and Engaged   Plan: Continue to engage patient in OT groups 2 - 3x/week.  10/22/2019  Donne Hazel, MOT, OTR/L

## 2019-10-22 NOTE — Progress Notes (Signed)
   10/22/19 0727  Vital Signs  Pulse Rate (!) 112  Pulse Rate Source Dinamap  BP 130/77  BP Location Right Arm  BP Method Automatic  Patient Position (if appropriate) Standing  Oxygen Therapy  SpO2 100 %  O2 Device Room Air   D: Patient denies SI/HI/AVH Pt. Rated anxiety 3/10 and depression 1/10. Pt. Reported poor sleep. A:  Patient took scheduled medicine.  Support and encouragement provided Routine safety checks conducted every 15 minutes. Patient  Informed to notify staff with any concerns.  R: Safety maintained.

## 2019-10-22 NOTE — Progress Notes (Signed)
   10/21/19 2139  COVID-19 Daily Checkoff  Have you had a fever (temp > 37.80C/100F)  in the past 24 hours?  No  COVID-19 EXPOSURE  Have you traveled outside the state in the past 14 days? No  Have you been in contact with someone with a confirmed diagnosis of COVID-19 or PUI in the past 14 days without wearing appropriate PPE? No  Have you been living in the same home as a person with confirmed diagnosis of COVID-19 or a PUI (household contact)? No  Have you been diagnosed with COVID-19? No

## 2019-10-23 MED ORDER — RISPERIDONE 2 MG PO TBDP
2.0000 mg | ORAL_TABLET | Freq: Every day | ORAL | 0 refills | Status: DC
Start: 2019-10-24 — End: 2019-11-20

## 2019-10-23 MED ORDER — NICOTINE POLACRILEX 2 MG MT GUM
2.0000 mg | CHEWING_GUM | OROMUCOSAL | 0 refills | Status: AC | PRN
Start: 2019-10-23 — End: 2019-11-22

## 2019-10-23 MED ORDER — NICOTINE 21 MG/24HR TD PT24
21.0000 mg | MEDICATED_PATCH | Freq: Every day | TRANSDERMAL | 0 refills | Status: AC
Start: 2019-10-24 — End: ?

## 2019-10-23 MED ORDER — BENZOCAINE 10 % MT GEL: Freq: Three times a day (TID) | OROMUCOSAL | 0 refills | Status: AC | PRN

## 2019-10-23 MED ORDER — RISPERIDONE 4 MG PO TBDP
4.0000 mg | ORAL_TABLET | Freq: Every day | ORAL | 0 refills | Status: DC
Start: 2019-10-23 — End: 2019-11-20

## 2019-10-23 NOTE — BHH Suicide Risk Assessment (Signed)
[facility] Discharge Suicide Risk Assessment   Principal Problem: Psychosis [facility]) Discharge Diagnoses: Principal Problem:   Psychosis (HCC)   Total Time spent with patient: 30 minutes  Musculoskeletal: Strength & Muscle Tone: within normal limits Gait & Station: normal Patient leans: N/A  Psychiatric Specialty Exam: Review of Systems  Constitutional: Negative for activity change, appetite change and chills.  Eyes: Negative for redness.  Respiratory: Negative for shortness of breath.   Cardiovascular: Negative for chest pain.  Gastrointestinal: Negative for abdominal pain.  Neurological: Negative for seizures and facial asymmetry.  Psychiatric/Behavioral: Negative for agitation.    Blood pressure (!) 138/120, pulse (!) 131, temperature 98 F (36.7 C), temperature source Oral, resp. rate 16, height 5\' 10"  (1.778 m), weight 68 kg, SpO2 100 %.Body mass index is 21.51 kg/m.  General Appearance: Casual and Fairly Groomed  ::  Fair  Speech:  Clear and Coherent and Normal Rate409  Volume:  Normal  Mood:  "better"  Affect:  Appropriate and Congruent  Thought Process:  Coherent, Goal Directed and Linear  Orientation:  Full (Time, Place, and Person)  Thought Content:  WDL and Logical  Suicidal Thoughts:  No  Homicidal Thoughts:  No  Memory:  Immediate;   Fair Recent;   Fair  Judgement:  Fair  Insight:  Fair  Psychomotor Activity:  Normal  Concentration:  Fair  Recall:  [phone] of Knowledge:Fair  Language: Good  Akathisia:  No  Handed:  Right  AIMS (if indicated):     Assets:  Communication Skills Desire for Improvement Housing Physical Health Resilience Social Support  Sleep:  Number of Hours: 1.5  Cognition: WNL  ADL's:  Intact   Mental Status Per Nursing Assessment::   On Admission:  NA  Demographic Factors:  Male, Adolescent or young adult, Caucasian, Unemployed and Access to firearms (firearms now locked up)  Loss Factors: NA  Historical  Factors: NA  Risk Reduction Factors:   Sense of responsibility to family, Living with another person, especially a relative and Positive social support  Continued Clinical Symptoms:  n/a  Cognitive Features That Contribute To Risk:  None    Suicide Risk:  Minimal: No identifiable suicidal ideation.  Patients presenting with no risk factors but with morbid ruminations; may be classified as minimal risk based on the severity of the depressive symptoms   Follow-up Information    [phone], MD. Call.   Specialty: Pediatrics Contact information: [address] [place] [phone]        BEHAVIORAL HEALTH INTENSIVE CHEMICAL DEPENDENCY. Go on 10/26/2019.   Specialty: Behavioral Health Why: You have an appointment on 10/26/19 at 9:30 am for the chemical dependency intensive outpatient program.  This appointment will be held in person.  Please bring your insurance card and wear a mask. Contact information: [address] [place] [phone]              Plan Of Care/Follow-up recommendations:  Activity:  as tolerated Diet:  regular Other:      On my interview, patient is in NAD, alert, oriented, calm, cooperative, and attentive, with normal affect, speech, and behavior. Objectively, there is no evidence of psychosis/ mania (able to converse coherently, linear and goal directed thought, no RIS, no distractibility, not pre-occupied, no FOI, etc) nor depression to the point of suicidality (able to concentrate, affect full and reactive, speech normal r/v/t, no psychomotor retardation/agitation, etc).  Overall, patient appears to be at the  point, in the absence of inhibiting or disinhibiting symptoms, where she can successfully move to lesser restrictive setting for care. Patient is stable for discharge.  On day of discharge, patient acknowledges that his drug use is problematic and is willing to attend  rehab.  Patient is instructed prior to discharge to: Take all medications as prescribed by his/her mental healthcare provider. Report any adverse effects and or reactions from the medicines to his/her outpatient provider promptly. Patient has been instructed & cautioned: To not engage in alcohol and or illegal drug use while on prescription medicines. In the event of worsening symptoms, patient is instructed to call the crisis hotline, 911 and or go to the nearest ED for appropriate evaluation and treatment of symptoms. To follow-up with his/her primary care provider for your other medical issues, concerns and or health care needs.     Estella Husk, MD 10/23/2019, 11:46 AM

## 2019-10-23 NOTE — Progress Notes (Signed)
Adult Psychoeducational Group Note  Date:  10/23/2019 Time:  3:24 PM  Group Topic/Focus:  Emotional Education:   The focus of this group is to discuss what feelings/emotions are, and how they are experienced.  Participation Level:  Active  Participation Quality:  Appropriate  Affect:  Angry  Cognitive:  Appropriate  Insight: Appropriate  Engagement in Group:  Engaged  Modes of Intervention:  Discussion and Education  Additional Comments:  Pt was able to attend group this morning.  Dannell Raczkowski E 10/23/2019, 3:24 PM

## 2019-10-23 NOTE — Tx Team (Addendum)
Interdisciplinary Treatment and Diagnostic Plan Update  10/23/2019 Time of Session: 10:20am  Kenneth Griffith MRN: [id number]  Principal Diagnosis: Psychosis [facility])  Secondary Diagnoses: Principal Problem:   Psychosis (HCC)   Current Medications:  Current Facility-Administered Medications  Medication Dose Route Frequency Provider Last Rate Last Admin  . acetaminophen (TYLENOL) tablet 650 mg  650 mg Oral Q6H PRN Patrcia Dolly, FNP   650 mg at 10/21/19 0750  . alum & mag hydroxide-simeth (MAALOX/MYLANTA) 200-200-20 MG/5ML suspension 30 mL  30 mL Oral Q4H PRN Patrcia Dolly, FNP      . benzocaine (ORAJEL) 10 % mucosal gel   Mouth/Throat TID PRN Estella Husk, MD   Given at 10/21/19 1707  . hydrOXYzine (ATARAX/VISTARIL) tablet 25 mg  25 mg Oral TID PRN Patrcia Dolly, FNP   25 mg at 10/21/19 2139  . ziprasidone (GEODON) injection 10 mg  10 mg Intramuscular Q12H PRN Jackelyn Poling, NP       And  . LORazepam (ATIVAN) injection 2 mg  2 mg Intramuscular Q12H PRN Nira Conn A, NP      . magnesium hydroxide (MILK OF MAGNESIA) suspension 30 mL  30 mL Oral Daily PRN Patrcia Dolly, FNP      . nicotine (NICODERM CQ - dosed in mg/24 hours) patch 21 mg  21 mg Transdermal Daily Estella Husk, MD   21 mg at 10/23/19 0825  . nicotine polacrilex (NICORETTE) gum 2 mg  2 mg Oral PRN Nira Conn A, NP      . pantoprazole (PROTONIX) EC tablet 20 mg  20 mg Oral Daily Karsten Ro, MD   20 mg at 10/23/19 0821  . risperiDONE (RISPERDAL M-TABS) disintegrating tablet 2 mg  2 mg Oral Daily Karsten Ro, MD   2 mg at 10/23/19 0821  . risperiDONE (RISPERDAL M-TABS) disintegrating tablet 4 mg  4 mg Oral QHS Karsten Ro, MD   4 mg at 10/22/19 2131  . traZODone (DESYREL) tablet 50 mg  50 mg Oral QHS PRN Jackelyn Poling, NP   50 mg at 10/22/19 2131   PTA Medications: Medications Prior to Admission  Medication Sig Dispense Refill Last Dose  . AUVI-Q 0.3 MG/0.3ML SOAJ injection Use as directed for  life-threatening allergic reaction. 4 Device 3   . loratadine (CLARITIN) 10 MG tablet Take 10 mg by mouth daily as needed for allergies.       Patient Stressors: Financial difficulties Substance abuse  Patient Strengths: Average or above average intelligence Communication skills Supportive family/friends  Treatment Modalities: Medication Management, Group therapy, Case management,  1 to 1 session with clinician, Psychoeducation, Recreational therapy.   Physician Treatment Plan for Primary Diagnosis: Psychosis (HCC) Long Term Goal(s): Improvement in symptoms so as ready for discharge Improvement in symptoms so as ready for discharge   Short Term Goals: Ability to identify changes in lifestyle to reduce recurrence of condition will improve Ability to verbalize feelings will improve Ability to demonstrate self-control will improve Ability to identify and develop effective coping behaviors will improve Ability to maintain clinical measurements within normal limits will improve Compliance with prescribed medications will improve Ability to identify triggers associated with substance abuse/mental health issues will improve Ability to identify changes in lifestyle to reduce recurrence of condition will improve Ability to verbalize feelings will improve Ability to demonstrate self-control will improve Ability to identify and develop effective coping behaviors will improve Ability to maintain clinical measurements within normal limits will improve Compliance with prescribed  medications will improve Ability to identify triggers associated with substance abuse/mental health issues will improve  Medication Management: Evaluate patient's response, side effects, and tolerance of medication regimen.  Therapeutic Interventions: 1 to 1 sessions, Unit Group sessions and Medication administration.  Evaluation of Outcomes: Adequate for Discharge  Physician Treatment Plan for Secondary Diagnosis:  Principal Problem:   Psychosis (HCC)  Long Term Goal(s): Improvement in symptoms so as ready for discharge Improvement in symptoms so as ready for discharge   Short Term Goals: Ability to identify changes in lifestyle to reduce recurrence of condition will improve Ability to verbalize feelings will improve Ability to demonstrate self-control will improve Ability to identify and develop effective coping behaviors will improve Ability to maintain clinical measurements within normal limits will improve Compliance with prescribed medications will improve Ability to identify triggers associated with substance abuse/mental health issues will improve Ability to identify changes in lifestyle to reduce recurrence of condition will improve Ability to verbalize feelings will improve Ability to demonstrate self-control will improve Ability to identify and develop effective coping behaviors will improve Ability to maintain clinical measurements within normal limits will improve Compliance with prescribed medications will improve Ability to identify triggers associated with substance abuse/mental health issues will improve     Medication Management: Evaluate patient's response, side effects, and tolerance of medication regimen.  Therapeutic Interventions: 1 to 1 sessions, Unit Group sessions and Medication administration.  Evaluation of Outcomes: Adequate for Discharge   RN Treatment Plan for Primary Diagnosis: Psychosis (HCC) Long Term Goal(s): Knowledge of disease and therapeutic regimen to maintain health will improve  Short Term Goals: Ability to remain free from injury will improve, Ability to verbalize frustration and anger appropriately will improve, Ability to participate in decision making will improve, Ability to disclose and discuss suicidal ideas and Ability to identify and develop effective coping behaviors will improve  Medication Management: RN will administer medications as ordered  by provider, will assess and evaluate patient's response and provide education to patient for prescribed medication. RN will report any adverse and/or side effects to prescribing provider.  Therapeutic Interventions: 1 on 1 counseling sessions, Psychoeducation, Medication administration, Evaluate responses to treatment, Monitor vital signs and CBGs as ordered, Perform/monitor CIWA, COWS, AIMS and Fall Risk screenings as ordered, Perform wound care treatments as ordered.  Evaluation of Outcomes: Adequate for Discharge   LCSW Treatment Plan for Primary Diagnosis: Psychosis [facility]) Long Term Goal(s): Safe transition to appropriate next level of care at discharge, Engage patient in therapeutic group addressing interpersonal concerns.  Short Term Goals: Engage patient in aftercare planning with referrals and resources, Increase social support, Increase emotional regulation, Facilitate acceptance of mental health diagnosis and concerns, Facilitate patient progression through stages of change regarding substance use diagnoses and concerns and Identify triggers associated with mental health/substance abuse issues  Therapeutic Interventions: Assess for all discharge needs, 1 to 1 time with Social worker, Explore available resources and support systems, Assess for adequacy in community support network, Educate family and significant other(s) on suicide prevention, Complete Psychosocial Assessment, Interpersonal group therapy.  Evaluation of Outcomes: Adequate for Discharge   Progress in Treatment: Attending groups: No. Participating in groups: No. Taking medication as prescribed: Yes. Toleration medication: Yes. Family/Significant other contact made: Yes, individual(s) contacted:  Father Patient understands diagnosis: Yes. and No. Discussing patient identified problems/goals with staff: Yes. Medical problems stabilized or resolved: Yes. Denies suicidal/homicidal ideation: Yes. Issues/concerns per  patient self-inventory: No.   New problem(s) identified: No, Describe:  None  New  Short Term/Long Term Goal(s): medication stabilization, elimination of SI thoughts, development of comprehensive mental wellness plan.   Patient Goals:  "To get out of here"   Discharge Plan or Barriers: Patient will return home to his mother and step-father and will follow up with local mental health agencies for therapy and medication management.   Reason for Continuation of Hospitalization: Medication stabilization  Estimated Length of Stay: Adequate for discharge   Attendees: Patient: Kenneth Griffith  10/23/2019   Physician:  10/23/2019   Nursing: Earlene Plater, MD 10/23/2019   RN Care Manager: 10/23/2019   Social Worker: Melba Coon, LCSW 10/23/2019   Recreational Therapist:  10/23/2019   Other:  10/23/2019   Other:  10/23/2019   Other: 10/23/2019      Scribe for Treatment Team: Aram Beecham, LCSWA 10/23/2019 1:45 PM
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
## 2019-10-23 NOTE — Progress Notes (Signed)
D: Pt A & O X 4. Presents animated but anxious on interactions with fair eye contact, logical speech. Denies SI, HI, AVH and pain at this time. D/C home as ordered. Picked up in lobby by parents. A: D/C instructions reviewed with pt including prescriptionsand follow up appointments; compliance encouraged. Pt had no belongings in locker at time of d/c. Scheduled medications given with verbal education and effects monitored. Safety checks maintained without incident till time of d/c.  R: Pt receptive to care. Compliant with medications when offered. Denies adverse drug reactions when assessed. Verbalized understanding related to d/c instructions. Signed belonging sheet in agreement to having no items in locker at this time. Ambulatory with a steady gait. Appears to be in no physical distress at time of departure.

## 2019-10-23 NOTE — Progress Notes (Signed)
  [facility] Adult Case Management Discharge Plan :  Will you be returning to the same living situation after discharge:  No. Will be staying with father at discharge At discharge, do you have transportation home?: Yes,  father to pick this patient up Do you have the ability to pay for your medications: Yes,  has insurance  Release of information consent forms completed and in the chart;  Patient's signature needed at discharge.  Patient to Follow up at:  Follow-up Information    Maryellen Pile, MD. Call.   Specialty: Pediatrics Contact information: [address] [place] [address] [phone]        BEHAVIORAL HEALTH INTENSIVE CHEMICAL DEPENDENCY. Schedule an appointment as soon as possible for a visit.   Specialty: Behavioral Health Why: A referral has been made for the chemical dependency intensive outpatient program.  The provider will call you to schedule your appointment. Contact information: [phone] mc [place] [address] [phone]              Next level of care provider has access to [facility] Link:yes  Safety Planning and Suicide Prevention discussed: Yes,  with father  Have you used any form of tobacco in the last 30 days? (Cigarettes, Smokeless Tobacco, Cigars, and/or Pipes): Yes  Has patient been referred to the Quitline?: Patient refused referral  Patient has been referred for addiction treatment: Pt. refused referral  Otelia Santee, LCSW 10/23/2019, 11:01 AM

## 2019-10-23 NOTE — Progress Notes (Signed)
Pt is cooperative with treatment. He was visible during the evening in the dayroom with peers. He is anxious on approach but pleasant and logical. He denies depression or AVH. He is currently in bed resting eyes closed. No issues to report on shift at this time.

## 2019-10-26 ENCOUNTER — Ambulatory Visit (HOSPITAL_COMMUNITY): Payer: BC Managed Care – PPO | Admitting: Licensed Clinical Social Worker

## 2019-10-28 ENCOUNTER — Other Ambulatory Visit: Payer: Self-pay

## 2019-10-28 ENCOUNTER — Ambulatory Visit (HOSPITAL_COMMUNITY): Payer: BC Managed Care – PPO | Admitting: Licensed Clinical Social Worker
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
## 2019-11-20 ENCOUNTER — Telehealth (INDEPENDENT_AMBULATORY_CARE_PROVIDER_SITE_OTHER): Payer: BC Managed Care – PPO | Admitting: Psychiatry

## 2019-11-20 ENCOUNTER — Encounter (HOSPITAL_COMMUNITY): Payer: Self-pay | Admitting: Psychiatry

## 2019-11-20 DIAGNOSIS — F12959 Cannabis use, unspecified with psychotic disorder, unspecified: Secondary | ICD-10-CM

## 2019-11-20 DIAGNOSIS — F23 Brief psychotic disorder: Secondary | ICD-10-CM | POA: Diagnosis not present

## 2019-11-20 MED ORDER — RISPERIDONE 1 MG PO TABS: 1.0000 mg | ORAL_TABLET | Freq: Every day | ORAL | 0 refills | Status: DC

## 2019-11-20 MED ORDER — RISPERIDONE 4 MG PO TBDP
4.0000 mg | ORAL_TABLET | Freq: Every day | ORAL | 0 refills | Status: DC
Start: 2019-11-20 — End: 2019-11-23

## 2019-11-20 NOTE — Progress Notes (Signed)
Psychiatric Initial Adult Assessment   Patient Identification: Kenneth Griffith MRN:  [id number] Date of Evaluation:  11/20/2019 Referral Source: [facility] discharge Chief Complaint:  establish care, follow up discharge from hospital Visit Diagnosis:    ICD-10-CM   1. Cannabis-induced psychotic disorder (HCC)  F12.959   2. Brief psychotic disorder (HCC)  F23    I connected with [name] on 11/20/19 at  9:00 AM EST by a video enabled telemedicine application and verified that I am speaking with the correct person using two identifiers. Mom also contributed to the assessment and agreed with plan Location: Patient: home Provider: home office,    I discussed the limitations of evaluation and management by telemedicine and the availability of in person appointments. The patient expressed understanding and agreed to proceed.  History of Present Illness: Patient is a 19 years old currently single Caucasian male living with his mom and stepdad has been recently discharged from behavioral health unit he was admitted for 5 days for apparent bizarre and odd behavior including being belligerent diagnosed with psychotic disorder unspecified or brief psychotic disorder.  He was using THC   He was stabilized in the hospital still exhibited some bizarre behavior or behavior for the first couple of days he was discharged with follow-up with partial program or IOP.  He is currently on Risperdal 2 mg in the morning and 4 mg at night  He appears to be doing fair also discussed with medication with mom patient has cut down to 2 mg during the daytime to 1 mg because he was feeling sedated  Does not appear to be psychotic does not appear to be responding to internal stimuli he was clear and coherent mood was described to be euthymic mom states that he is back to baseline.  Does not endorse hopelessness  Patient endorses using marijuana for the last 2 years but recently it has gone excessive and prior to admission  he was at [facility] with his friends states that there was excessive use of marijuana and then he came in he was acting out and somewhat destructive so his dad was called in and he was taken to the hospital  He has not been using marijuana since then reports his sleep is more regular prior to admission his sleep is irregular and less He has not yet attended IOP program keeps himself busy with videogames he has supportive mom,   No past psychiatric admission no past psychiatric treatment or suicide attempt  Denies past use of any other drugs except marijuana   Aggravating factors; recent hospital admission marijuana use at times difficult relationship with dad and stepdad with yelling  Modifying factors; mom, friends  Mom states that he has done good in high school denies paranoid behavior before     Past Psychiatric History: denies  Previous Psychotropic Medications: No   Substance Abuse History in the last 12 months:  Yes.    Consequences of Substance Abuse: psychosis related to drugs and depression, impaired judjement  Past Medical History:  Past Medical History:  Diagnosis Date  . Bradycardia    had cardiology workup in 2013:  "structurally normal heart"  . Food allergy    Tree Nuts  . Gastroesophageal reflux    no current med.  . Seasonal allergies   . Thumb fracture 04/2014   right    Past Surgical History:  Procedure Laterality Date  . ADENOIDECTOMY    . OPEN REDUCTION INTERNAL FIXATION (ORIF) METACARPAL Right 05/04/2014  Procedure: OPEN REDUCTION INTERNAL FIXATION (ORIF) RIGHT THUMB ;  Surgeon: Cindee Salt, MD;  Location: [facility];  Service: Orthopedics;  Laterality: Right;  . PERCUTANEOUS PINNING  04/11/2011   Procedure: PERCUTANEOUS PINNING EXTREMITY;  Surgeon: Nicki Reaper, MD;  Location: [facility];  Service: Orthopedics;  Laterality: Right;  PERCUTANEOUS PINNING RIGHT LITTLE AND RING FINGERS  . TONSILLECTOMY  age 55 yrs.  .  TYMPANOSTOMY TUBE PLACEMENT  as an infant    Family Psychiatric History: denies  Family History:  Family History  Problem Relation Age of Onset  . Diabetes Maternal Grandfather   . Kidney disease Maternal Grandfather        not on dialysis yet    Social History:   Social History   Socioeconomic History  . Marital status: Single    Spouse name: Not on file  . Number of children: Not on file  . Years of education: Not on file  . Highest education level: Not on file  Occupational History  . Not on file  Tobacco Use  . Smoking status: Former Games developer  . Smokeless tobacco: Never Used  Vaping Use  . Vaping Use: Former  Substance and Sexual Activity  . Alcohol use: Yes  . Drug use: Yes    Types: Marijuana  . Sexual activity: Not on file  Other Topics Concern  . Not on file  Social History Narrative  . Not on file   Social Determinants of Health   Financial Resource Strain:   . Difficulty of Paying Living Expenses: Not on file  Food Insecurity:   . Worried About Programme researcher, broadcasting/film/video in the Last Year: Not on file  . Ran Out of Food in the Last Year: Not on file  Transportation Needs:   . Lack of Transportation (Medical): Not on file  . Lack of Transportation (Non-Medical): Not on file  Physical Activity:   . Days of Exercise per Week: Not on file  . Minutes of Exercise per Session: Not on file  Stress:   . Feeling of Stress : Not on file  Social Connections:   . Frequency of Communication with Friends and Family: Not on file  . Frequency of Social Gatherings with Friends and Family: Not on file  . Attends Religious Services: Not on file  . Active Member of Clubs or Organizations: Not on file  . Attends Banker Meetings: Not on file  . Marital Status: Not on file    Additional Social History: grew up with parents. Fine, no trauma, attended school with A grades left [employer] as sophormore  Allergies:   Allergies  Allergen Reactions  . Tree Extract  Anaphylaxis    TREE NUTS    Metabolic Disorder Labs: No results found for: HGBA1C, MPG No results found for: PROLACTIN No results found for: CHOL, TRIG, HDL, CHOLHDL, VLDL, LDLCALC No results found for: TSH  Therapeutic Level Labs: No results found for: LITHIUM No results found for: CBMZ No results found for: VALPROATE  Current Medications: Current Outpatient Medications  Medication Sig Dispense Refill  . AUVI-Q 0.3 MG/0.3ML SOAJ injection Use as directed for life-threatening allergic reaction. 4 Device 3  . benzocaine (ORAJEL) 10 % mucosal gel Use as directed in the mouth or throat 3 (three) times daily as needed for mouth pain (for oral pain, oral blister on tongue). 5.3 g 0  . loratadine (CLARITIN) 10 MG tablet Take 10 mg by mouth daily as needed for allergies.    Marland Kitchen  nicotine (NICODERM CQ - DOSED IN MG/24 HOURS) 21 mg/24hr patch Place 1 patch (21 mg total) onto the skin daily. 28 patch 0  . nicotine polacrilex (NICORETTE) 2 MG gum Take 1 each (2 mg total) by mouth as needed for smoking cessation. 100 tablet 0  . risperiDONE (RISPERDAL M-TABS) 4 MG disintegrating tablet Take 1 tablet (4 mg total) by mouth at bedtime. 30 tablet 0  . risperiDONE (RISPERDAL) 1 MG tablet Take 1 tablet (1 mg total) by mouth daily. 30 tablet 0   No current facility-administered medications for this visit.      Psychiatric Specialty Exam: Review of Systems  Cardiovascular: Negative for chest pain.  Neurological: Negative for tremors.  Psychiatric/Behavioral: Negative for agitation.    There were no vitals taken for this visit.There is no height or weight on file to calculate BMI.  General Appearance: Casual  Eye Contact:  Fair  Speech:  Normal Rate  Volume:  Normal  Mood:  Euthymic  Affect:  Congruent  Thought Process:  Goal Directed  Orientation:  Full (Time, Place, and Person)  Thought Content:  Logical  Suicidal Thoughts:  No  Homicidal Thoughts:  No  Memory:  Immediate;   Fair Recent;    Fair  Judgement:  Fair  Insight:  Shallow  Psychomotor Activity:  Normal  Concentration:  Concentration: Fair and Attention Span: Fair  Recall:  Poor  Fund of Knowledge:Good  Language: Fair  Akathisia:  No  Handed:    AIMS (if indicated): no involuntary movements  Assets:  Desire for Improvement Physical Health Social Support  ADL's:  Intact  Cognition: WNL  Sleep:  Fair   Screenings: AIMS     [facility]  AIMS Total Score 0    AUDIT     Ad[facility]  Alcohol Use Disorder Identification Test Final Score (AUDIT) 3      Assessment and Plan: as follows  Cannabinoid induced psychotic disorder; he is doing fair mom thinks he is back to baseline he remains sober discussed to probably attend IOP program if possible continue Resporal but the dose will be 1 mg during the day as he feels sedated with a 2 mg continue 4 mg at night  Brief psychotic disorder; doing better continue above medication discussed with mom as she wanted to taper down medication.  We can probably cut down the medication to 2 mg at night in the next 1 month I will see him back in a month and then we will continue taper down mom will keep him under observation patient remained sober Mom agrees with the plan  I discussed the assessment and treatment plan with the patient. The patient was provided an opportunity to ask questions and all were answered. The patient agreed with the plan and demonstrated an understanding of the instructions.   The patient was advised to call back or seek an in-person evaluation if the symptoms worsen or if the condition fails to improve as anticipated.  I provided 35 minutes of non-face-to-face time during this encounter. Thresa Ross, MD 11/12/20219:55 AM

## 2019-11-23 ENCOUNTER — Telehealth (HOSPITAL_COMMUNITY): Payer: Self-pay

## 2019-11-23 MED ORDER — RISPERIDONE 4 MG PO TBDP
4.0000 mg | ORAL_TABLET | Freq: Every day | ORAL | 0 refills | Status: DC
Start: 2019-11-23 — End: 2020-01-28

## 2019-11-23 NOTE — Telephone Encounter (Signed)
Resent patients 4mg  risperidone, because it did not go thru do to it being on print when doctor sent it in

## 2019-11-27 ENCOUNTER — Telehealth (HOSPITAL_COMMUNITY): Payer: Self-pay | Admitting: Psychiatry

## 2019-11-27 MED ORDER — RISPERIDONE 1 MG PO TBDP: 1.0000 mg | ORAL_TABLET | Freq: Every day | ORAL | 0 refills | Status: DC

## 2019-11-27 NOTE — Telephone Encounter (Signed)
I sent the M tab to pharmacy for 1mg .

## 2019-11-27 NOTE — Telephone Encounter (Signed)
Dad calling.  He believes the risperidone 1mg  rx was written wrong.  He thinks it should have been written as the disintegrating tablet, but it is not.  Kenneth Griffith is putting it under his tounge and it is not disintegrating.   Dad wants to know if this should have been written differently or was this changed?  Please advise.  [facility].   Dads number [phone]

## 2019-12-13 ENCOUNTER — Other Ambulatory Visit (HOSPITAL_COMMUNITY): Payer: Self-pay | Admitting: Psychiatry

## 2019-12-21 ENCOUNTER — Encounter (HOSPITAL_COMMUNITY): Payer: Self-pay | Admitting: Psychiatry

## 2019-12-21 ENCOUNTER — Telehealth (INDEPENDENT_AMBULATORY_CARE_PROVIDER_SITE_OTHER): Payer: BC Managed Care – PPO | Admitting: Psychiatry

## 2019-12-21 DIAGNOSIS — F23 Brief psychotic disorder: Secondary | ICD-10-CM

## 2019-12-21 DIAGNOSIS — F12959 Cannabis use, unspecified with psychotic disorder, unspecified: Secondary | ICD-10-CM | POA: Diagnosis not present

## 2019-12-21 MED ORDER — RISPERIDONE 2 MG PO TABS: 2.0000 mg | ORAL_TABLET | Freq: Two times a day (BID) | ORAL | 0 refills | Status: DC

## 2019-12-21 NOTE — Progress Notes (Signed)
[facility] Follow up visit  Patient Identification: Kenneth Griffith MRN:  [id number] Date of Evaluation:  12/21/2019 Referral Source: [facility] discharge Chief Complaint:  establish care, follow up discharge from hospital Visit Diagnosis:    ICD-10-CM   1. Cannabis-induced psychotic disorder (HCC)  F12.959   2. Brief psychotic disorder (HCC)  F23     I connected with Kenneth Griffith on 12/21/19 at 11:00 AM EST by a video enabled telemedicine application and verified that I am speaking with the correct person using two identifiers. Dad today  also contributed to the assessment and agreed with plan Location: Patient: home Provider: home office,    I discussed the limitations of evaluation and management by telemedicine and the availability of in person appointments. The patient expressed understanding and agreed to proceed.  History of Present Illness: Patient is a 19 years old currently single Caucasian male living with his mom and stepdad initially referred after discharged from behavioral health unit he was admitted for 5 days for apparent bizarre and odd behavior including being belligerent diagnosed with psychotic disorder unspecified or brief psychotic disorder.  He was using THC   He was stabilized in the hospital still exhibited some bizarre behavior or behavior for the first couple of days he was discharged with follow-up with partial program or IOP.  He is currently on Risperdal 2 mg in the morning and 4 mg at night  He was at [facility] state with his friends states that there was excessive use of marijuana   He continues to do well, last visit we lowered risperdal to 1mg  am 4mg  qhs. Now he is only taking at night, more alrert Has sipped on alcohol at times No paranoia and avoiding peers to avoid THC , insight improving   Denies past use of any other drugs except marijuana   Aggravating factors; recent hospital admission marijuana use at times difficult relationship with dad and stepdad  with yelling  Modifying factors; mom, friends  Dad denies of any knowledge of pscyh history  Past Psychiatric History: denies  Previous Psychotropic Medications: No   Substance Abuse History in the last 12 months:  Yes.    Consequences of Substance Abuse: psychosis related to drugs and depression, impaired judjement  Past Medical History:  Past Medical History:  Diagnosis Date  . Bradycardia    had cardiology workup in 2013:  "structurally normal heart"  . Food allergy    Tree Nuts  . Gastroesophageal reflux    no current med.  . Seasonal allergies   . Thumb fracture 04/2014   right    Past Surgical History:  Procedure Laterality Date  . ADENOIDECTOMY    . OPEN REDUCTION INTERNAL FIXATION (ORIF) METACARPAL Right 05/04/2014   Procedure: OPEN REDUCTION INTERNAL FIXATION (ORIF) RIGHT THUMB ;  Surgeon: 05/2014, MD;  Location: [facility];  Service: Orthopedics;  Laterality: Right;  . PERCUTANEOUS PINNING  04/11/2011   Procedure: PERCUTANEOUS PINNING EXTREMITY;  Surgeon: Cindee Salt, MD;  Location: [facility];  Service: Orthopedics;  Laterality: Right;  PERCUTANEOUS PINNING RIGHT LITTLE AND RING FINGERS  . TONSILLECTOMY  age 34 yrs.  . TYMPANOSTOMY TUBE PLACEMENT  as an infant    Family Psychiatric History: denies  Family History:  Family History  Problem Relation Age of Onset  . Diabetes Maternal Grandfather   . Kidney disease Maternal Grandfather        not on dialysis yet    Social History:   Social History  Socioeconomic History  . Marital status: Single    Spouse name: Not on file  . Number of children: Not on file  . Years of education: Not on file  . Highest education level: Not on file  Occupational History  . Not on file  Tobacco Use  . Smoking status: Former Games developer  . Smokeless tobacco: Never Used  Vaping Use  . Vaping Use: Former  Substance and Sexual Activity  . Alcohol use: Yes  . Drug use: Yes    Types:  Marijuana  . Sexual activity: Not on file  Other Topics Concern  . Not on file  Social History Narrative  . Not on file   Social Determinants of Health   Financial Resource Strain: Not on file  Food Insecurity: Not on file  Transportation Needs: Not on file  Physical Activity: Not on file  Stress: Not on file  Social Connections: Not on file      Allergies:   Allergies  Allergen Reactions  . Tree Extract Anaphylaxis    TREE NUTS    Metabolic Disorder Labs: No results found for: HGBA1C, MPG No results found for: PROLACTIN No results found for: CHOL, TRIG, HDL, CHOLHDL, VLDL, LDLCALC No results found for: TSH  Therapeutic Level Labs: No results found for: LITHIUM No results found for: CBMZ No results found for: VALPROATE  Current Medications: Current Outpatient Medications  Medication Sig Dispense Refill  . AUVI-Q 0.3 MG/0.3ML SOAJ injection Use as directed for life-threatening allergic reaction. 4 Device 3  . benzocaine (ORAJEL) 10 % mucosal gel Use as directed in the mouth or throat 3 (three) times daily as needed for mouth pain (for oral pain, oral blister on tongue). 5.3 g 0  . loratadine (CLARITIN) 10 MG tablet Take 10 mg by mouth daily as needed for allergies.    . nicotine (NICODERM CQ - DOSED IN MG/24 HOURS) 21 mg/24hr patch Place 1 patch (21 mg total) onto the skin daily. 28 patch 0  . risperiDONE (RISPERDAL M-TABS) 4 MG disintegrating tablet Take 1 tablet (4 mg total) by mouth at bedtime. 30 tablet 0  . risperiDONE (RISPERDAL) 2 MG tablet Take 1 tablet (2 mg total) by mouth 2 (two) times daily. 30 tablet 0   No current facility-administered medications for this visit.      Psychiatric Specialty Exam: Review of Systems  Cardiovascular: Negative for chest pain.  Neurological: Negative for tremors.  Psychiatric/Behavioral: Negative for agitation and hallucinations.    There were no vitals taken for this visit.There is no height or weight on file to  calculate BMI.  General Appearance: Casual  Eye Contact:  Fair  Speech:  Normal Rate  Volume:  Normal  Mood:  fair  Affect:  Congruent  Thought Process:  Goal Directed  Orientation:  Full (Time, Place, and Person)  Thought Content:  Logical  Suicidal Thoughts:  No  Homicidal Thoughts:  No  Memory:  Immediate;   Fair Recent;   Fair  Judgement:  Fair  Insight:  Shallow  Psychomotor Activity:  Normal  Concentration:  Concentration: Fair and Attention Span: Fair  Recall:  Poor  Fund of Knowledge:Good  Language: Fair  Akathisia:  No  Handed:    AIMS (if indicated): no involuntary movements  Assets:  Desire for Improvement Physical Health Social Support  ADL's:  Intact  Cognition: WNL  Sleep:  Fair   Screenings: AIMS   Flowsheet Row [facility]  AIMS  Total Score 0    AUDIT   Flowsheet Row Ad[facility]  Alcohol Use Disorder Identification Test Final Score (AUDIT) 3      Assessment and Plan: as follows  Cannabinoid induced psychotic disorder; doing fair, Dad keeping him home to avoid Peer or friends whom use tHC  We can lower risperdal to 3mg  qhs as denies paranoia Denies twitches, tremors Discussed to work on and weight maintenance Brief psychotic disorder; doing better; see above Avoid alcohol as well I discussed the assessment and treatment plan with the patient. The patient was provided an opportunity to ask questions and all were answered. The patient agreed with the plan and demonstrated an understanding of the instructions.   The patient was advised to call back or seek an in-person evaluation if the symptoms worsen or if the condition fails to improve as anticipated. Fu 37m.  I provided 15  minutes of non-face-to-face time during this encounter. 0m, MD 12/13/202111:17 AM

## 2019-12-29 ENCOUNTER — Other Ambulatory Visit (HOSPITAL_COMMUNITY): Payer: Self-pay

## 2019-12-29 MED ORDER — RISPERIDONE 2 MG PO TABS: 2.0000 mg | ORAL_TABLET | Freq: Two times a day (BID) | ORAL | 0 refills | Status: DC

## 2020-01-28 ENCOUNTER — Telehealth (INDEPENDENT_AMBULATORY_CARE_PROVIDER_SITE_OTHER): Payer: BC Managed Care – PPO | Admitting: Psychiatry

## 2020-01-28 ENCOUNTER — Encounter (HOSPITAL_COMMUNITY): Payer: Self-pay | Admitting: Psychiatry

## 2020-01-28 DIAGNOSIS — F12959 Cannabis use, unspecified with psychotic disorder, unspecified: Secondary | ICD-10-CM

## 2020-01-28 DIAGNOSIS — F23 Brief psychotic disorder: Secondary | ICD-10-CM

## 2020-01-28 MED ORDER — RISPERIDONE 2 MG PO TABS: 2.0000 mg | ORAL_TABLET | Freq: Every day | ORAL | 0 refills | Status: DC

## 2020-01-28 NOTE — Progress Notes (Signed)
[facility] Follow up visit  Patient Identification: Kenneth Griffith MRN:  [id number] Date of Evaluation:  01/28/2020 Referral Source: [facility] discharge Chief Complaint:   follow up psychosis  Visit Diagnosis:    ICD-10-CM   1. Cannabis-induced psychotic disorder (HCC)  F12.959   2. Brief psychotic disorder [facility])  F23    Virtual Visit via Video Note  I connected with Kenneth Griffith on 01/28/20 at 10:00 AM EST by a video enabled telemedicine application and verified that I am speaking with the correct person using two identifiers.  Location: Patient: home Provider: home office   I discussed the limitations of evaluation and management by telemedicine and the availability of in person appointments. The patient expressed understanding and agreed to proceed.     I discussed the assessment and treatment plan with the patient. The patient was provided an opportunity to ask questions and all were answered. The patient agreed with the plan and demonstrated an understanding of the instructions.   The patient was advised to call back or seek an in-person evaluation if the symptoms worsen or if the condition fails to improve as anticipated.  I provided 12 minutes of non-face-to-face time during this encounter.   Thresa Ross, MD    History of Present Illness: Patient is a 20 years old currently single Caucasian male living with his mom and stepdad initially referred after discharged from behavioral health unit he was admitted for 5 days for apparent bizarre and odd behavior including being belligerent diagnosed with psychotic disorder unspecified or brief psychotic disorder.  He was using THC   Doing fair, have been lowering risperdal slowly , currently n 3mg  at night Denies paranoia, hallucinations he feels back to baseline Keeps himself busy and denies using recent [facility]  He was at [facility] state with his friends states that there was excessive use of marijuana as per history which led to possible  psychosis    Denies past use of any other drugs except marijuana   Aggravating factors; recent hospital admission marijuana use at times difficult relationship with dad and stepdad with yelling  Modifying factors; mom, friends  Dad denies of any knowledge of pscyh history  Past Psychiatric History: denies  Previous Psychotropic Medications: No   Substance Abuse History in the last 12 months:  Yes.    Consequences of Substance Abuse: psychosis related to drugs and depression, impaired judjement  Past Medical History:  Past Medical History:  Diagnosis Date  . Bradycardia    had cardiology workup in 2013:  "structurally normal heart"  . Food allergy    Tree Nuts  . Gastroesophageal reflux    no current med.  . Seasonal allergies   . Thumb fracture 04/2014   right    Past Surgical History:  Procedure Laterality Date  . ADENOIDECTOMY    . OPEN REDUCTION INTERNAL FIXATION (ORIF) METACARPAL Right 05/04/2014   Procedure: OPEN REDUCTION INTERNAL FIXATION (ORIF) RIGHT THUMB ;  Surgeon: 05/06/2014, MD;  Location: [facility];  Service: Orthopedics;  Laterality: Right;  . PERCUTANEOUS PINNING  04/11/2011   Procedure: PERCUTANEOUS PINNING EXTREMITY;  Surgeon: 06/11/2011, MD;  Location: [facility];  Service: Orthopedics;  Laterality: Right;  PERCUTANEOUS PINNING RIGHT LITTLE AND RING FINGERS  . TONSILLECTOMY  age 69 yrs.  . TYMPANOSTOMY TUBE PLACEMENT  as an infant    Family Psychiatric History: denies  Family History:  Family History  Problem Relation Age of Onset  . Diabetes Maternal Grandfather   .  Kidney disease Maternal Grandfather        not on dialysis yet    Social History:   Social History   Socioeconomic History  . Marital status: Single    Spouse name: Not on file  . Number of children: Not on file  . Years of education: Not on file  . Highest education level: Not on file  Occupational History  . Not on file  Tobacco Use  .  Smoking status: Former Games developer  . Smokeless tobacco: Never Used  Vaping Use  . Vaping Use: Former  Substance and Sexual Activity  . Alcohol use: Yes  . Drug use: Yes    Types: Marijuana  . Sexual activity: Not on file  Other Topics Concern  . Not on file  Social History Narrative  . Not on file   Social Determinants of Health   Financial Resource Strain: Not on file  Food Insecurity: Not on file  Transportation Needs: Not on file  Physical Activity: Not on file  Stress: Not on file  Social Connections: Not on file      Allergies:   Allergies  Allergen Reactions  . Tree Extract Anaphylaxis    TREE NUTS    Metabolic Disorder Labs: No results found for: HGBA1C, MPG No results found for: PROLACTIN No results found for: CHOL, TRIG, HDL, CHOLHDL, VLDL, LDLCALC No results found for: TSH  Therapeutic Level Labs: No results found for: LITHIUM No results found for: CBMZ No results found for: VALPROATE  Current Medications: Current Outpatient Medications  Medication Sig Dispense Refill  . AUVI-Q 0.3 MG/0.3ML SOAJ injection Use as directed for life-threatening allergic reaction. 4 Device 3  . benzocaine (ORAJEL) 10 % mucosal gel Use as directed in the mouth or throat 3 (three) times daily as needed for mouth pain (for oral pain, oral blister on tongue). 5.3 g 0  . loratadine (CLARITIN) 10 MG tablet Take 10 mg by mouth daily as needed for allergies.    . nicotine (NICODERM CQ - DOSED IN MG/24 HOURS) 21 mg/24hr patch Place 1 patch (21 mg total) onto the skin daily. 28 patch 0  . risperiDONE (RISPERDAL) 2 MG tablet Take 1 tablet (2 mg total) by mouth at bedtime. 30 tablet 0   No current facility-administered medications for this visit.      Psychiatric Specialty Exam: Review of Systems  Cardiovascular: Negative for chest pain.  Neurological: Negative for tremors.  Psychiatric/Behavioral: Negative for agitation and hallucinations.    There were no vitals taken for this  visit.There is no height or weight on file to calculate BMI.  General Appearance: Casual  Eye Contact:  Fair  Speech:  Normal Rate  Volume:  Normal  Mood: fair  Affect:  Congruent  Thought Process:  Goal Directed  Orientation:  Full (Time, Place, and Person)  Thought Content:  Logical  Suicidal Thoughts:  No  Homicidal Thoughts:  No  Memory:  Immediate;   Fair Recent;   Fair  Judgement:  Fair  Insight:  Shallow  Psychomotor Activity:  Normal  Concentration:  Concentration: Fair and Attention Span: Fair  Recall:  Poor  Fund of Knowledge:Good  Language: Fair  Akathisia:  No  Handed:    AIMS (if indicated): no involuntary movements  Assets:  Desire for Improvement Physical Health Social Support  ADL's:  Intact  Cognition: WNL  Sleep:  Fair   Screenings: AIMS   Flowsheet Row [facility]  AIMS Total Score 0    AUDIT   Flowsheet Row Admission (Discharged[facility]  Alcohol Use Disorder Identification Test Final Score (AUDIT) 3     Prior documentation, copy reviewed  Assessment and Plan: as follows  Cannabinoid induced psychotic disorder; continues to do fair and improving, can lower risperdal to 2mg  qhs He has meds as prescribed dose was higher.   Denies twitches, tremors Discussed to work on and weight maintenance Brief psychotic disorder; doing fair, improving Avoid alcohol as well  Fu 51m.   0m, MD 1/20/202210:26 AM

## 2020-02-12 DIAGNOSIS — Z03818 Encounter for observation for suspected exposure to other biological agents ruled out: Secondary | ICD-10-CM | POA: Diagnosis not present

## 2020-02-12 DIAGNOSIS — R509 Fever, unspecified: Secondary | ICD-10-CM | POA: Diagnosis not present

## 2020-02-26 DIAGNOSIS — R509 Fever, unspecified: Secondary | ICD-10-CM | POA: Diagnosis not present

## 2020-03-02 ENCOUNTER — Ambulatory Visit: Payer: BC Managed Care – PPO | Admitting: Internal Medicine

## 2020-03-18 ENCOUNTER — Encounter (HOSPITAL_COMMUNITY): Payer: Self-pay | Admitting: Psychiatry

## 2020-03-18 ENCOUNTER — Telehealth (INDEPENDENT_AMBULATORY_CARE_PROVIDER_SITE_OTHER): Payer: BC Managed Care – PPO | Admitting: Psychiatry

## 2020-03-18 DIAGNOSIS — F12959 Cannabis use, unspecified with psychotic disorder, unspecified: Secondary | ICD-10-CM

## 2020-03-18 DIAGNOSIS — F23 Brief psychotic disorder: Secondary | ICD-10-CM | POA: Diagnosis not present

## 2020-03-18 MED ORDER — RISPERIDONE 1 MG PO TABS: 1.0000 mg | ORAL_TABLET | Freq: Two times a day (BID) | ORAL | 1 refills | Status: DC

## 2020-03-18 NOTE — Progress Notes (Signed)
[facility] Follow up visit  Patient Identification: Kenneth Griffith MRN:  [id number] Date of Evaluation:  03/18/2020 Referral Source: [facility] discharge Chief Complaint:   follow up psychosis  Visit Diagnosis:    ICD-10-CM   1. Cannabis-induced psychotic disorder (HCC)  F12.959   2. Brief psychotic disorder [facility])  F23    Virtual Visit via Video Note  I connected with Kenneth Griffith on 03/18/20 at 12:00 PM EST by a video enabled telemedicine application and verified that I am speaking with the correct person using two identifiers.  Location: Patient: home Provider: office   I discussed the limitations of evaluation and management by telemedicine and the availability of in person appointments. The patient expressed understanding and agreed to proceed.     I discussed the assessment and treatment plan with the patient. The patient was provided an opportunity to ask questions and all were answered. The patient agreed with the plan and demonstrated an understanding of the instructions.   The patient was advised to call back or seek an in-person evaluation if the symptoms worsen or if the condition fails to improve as anticipated.  I provided 10 minutes of non-face-to-face time during this encounter.   Thresa Ross, MD    History of Present Illness: Patient is a 20 years old currently single Caucasian male living with his mom and stepdad initially referred after discharged from behavioral health unit he was admitted for 5 days for apparent bizarre and odd behavior including being belligerent diagnosed with psychotic disorder unspecified or brief psychotic disorder.  He was using THC   Continues to do well, last visit risperdal was reduced to 2mg .  Not paranoiad, avoiding THC use and parents are supportive   Aggravating factors; recent hospital admission marijuana use at times difficult relationship with dad and stepdad with yelling as per history  Modifying factors; mom, friends  Dad denies of  any knowledge of pscyh history  Past Psychiatric History: denies  Previous Psychotropic Medications: No   Substance Abuse History in the last 12 months:  Yes.    Consequences of Substance Abuse: psychosis related to drugs and depression, impaired judjement  Past Medical History:  Past Medical History:  Diagnosis Date  . Bradycardia    had cardiology workup in 2013:  "structurally normal heart"  . Food allergy    Tree Nuts  . Gastroesophageal reflux    no current med.  . Seasonal allergies   . Thumb fracture 04/2014   right    Past Surgical History:  Procedure Laterality Date  . ADENOIDECTOMY    . OPEN REDUCTION INTERNAL FIXATION (ORIF) METACARPAL Right 05/04/2014   Procedure: OPEN REDUCTION INTERNAL FIXATION (ORIF) RIGHT THUMB ;  Surgeon: 05/06/2014, MD;  Location: [facility];  Service: Orthopedics;  Laterality: Right;  . PERCUTANEOUS PINNING  04/11/2011   Procedure: PERCUTANEOUS PINNING EXTREMITY;  Surgeon: 06/11/2011, MD;  Location: [facility];  Service: Orthopedics;  Laterality: Right;  PERCUTANEOUS PINNING RIGHT LITTLE AND RING FINGERS  . TONSILLECTOMY  age 42 yrs.  . TYMPANOSTOMY TUBE PLACEMENT  as an infant    Family Psychiatric History: denies  Family History:  Family History  Problem Relation Age of Onset  . Diabetes Maternal Grandfather   . Kidney disease Maternal Grandfather        not on dialysis yet    Social History:   Social History   Socioeconomic History  . Marital status: Single    Spouse name: Not on file  .  Number of children: Not on file  . Years of education: Not on file  . Highest education level: Not on file  Occupational History  . Not on file  Tobacco Use  . Smoking status: Former Games developer  . Smokeless tobacco: Never Used  Vaping Use  . Vaping Use: Former  Substance and Sexual Activity  . Alcohol use: Yes  . Drug use: Yes    Types: Marijuana  . Sexual activity: Not on file  Other Topics Concern  . Not  on file  Social History Narrative  . Not on file   Social Determinants of Health   Financial Resource Strain: Not on file  Food Insecurity: Not on file  Transportation Needs: Not on file  Physical Activity: Not on file  Stress: Not on file  Social Connections: Not on file      Allergies:   Allergies  Allergen Reactions  . Tree Extract Anaphylaxis    TREE NUTS    Metabolic Disorder Labs: No results found for: HGBA1C, MPG No results found for: PROLACTIN No results found for: CHOL, TRIG, HDL, CHOLHDL, VLDL, LDLCALC No results found for: TSH  Therapeutic Level Labs: No results found for: LITHIUM No results found for: CBMZ No results found for: VALPROATE  Current Medications: Current Outpatient Medications  Medication Sig Dispense Refill  . risperiDONE (RISPERDAL) 1 MG tablet Take 1 tablet (1 mg total) by mouth 2 (two) times daily. 60 tablet 1  . AUVI-Q 0.3 MG/0.3ML SOAJ injection Use as directed for life-threatening allergic reaction. 4 Device 3  . benzocaine (ORAJEL) 10 % mucosal gel Use as directed in the mouth or throat 3 (three) times daily as needed for mouth pain (for oral pain, oral blister on tongue). 5.3 g 0  . loratadine (CLARITIN) 10 MG tablet Take 10 mg by mouth daily as needed for allergies.    . nicotine (NICODERM CQ - DOSED IN MG/24 HOURS) 21 mg/24hr patch Place 1 patch (21 mg total) onto the skin daily. 28 patch 0   No current facility-administered medications for this visit.      Psychiatric Specialty Exam: Review of Systems  Cardiovascular: Negative for chest pain.  Neurological: Negative for tremors.  Psychiatric/Behavioral: Negative for agitation and hallucinations.    There were no vitals taken for this visit.There is no height or weight on file to calculate BMI.  General Appearance: Casual  Eye Contact:  Fair  Speech:  Normal Rate  Volume:  Normal  Mood: fair  Affect:  Congruent  Thought Process:  Goal Directed  Orientation:  Full (Time,  Place, and Person)  Thought Content:  Logical  Suicidal Thoughts:  No  Homicidal Thoughts:  No  Memory:  Immediate;   Fair Recent;   Fair  Judgement:  Fair  Insight:  Shallow  Psychomotor Activity:  Normal  Concentration:  Concentration: Fair and Attention Span: Fair  Recall:  Poor  Fund of Knowledge:Good  Language: Fair  Akathisia:  No  Handed:    AIMS (if indicated): no involuntary movements  Assets:  Desire for Improvement Physical Health Social Support  ADL's:  Intact  Cognition: WNL  Sleep:  Fair   Screenings: AIMS   Flowsheet Row [facility]  AIMS Total Score 0    AUDIT   Flowsheet Row Admission (Discharged[facility]  Alcohol Use Disorder Identification Test Final Score (AUDIT) 3    PHQ2-9   Flowsheet Row  Video Visit from 03/18/2020 in [facility] AT [place]  PHQ-2 Total Score 0    Flowsheet Row Video Visit from 03/18/2020 in [facility] AT [place] Most recent reading at 03/18/2020 12:07 PM Admission (Discharged[facility] Most recent reading at 10/19/2019 12:33 AM ED from 10/18/2019 in [facility] EM[facility]GENCY DEPART[facility] Most recent reading at 10/18/2019  2:33 PM  C-SSRS RISK CATEGORY No Risk No Risk No Risk     Prior documentation, copy reviewed  Assessment and Plan: as follows Prior documentation reviewed   Cannabinoid induced psychotic disorder; not paranoid, not using THC .   Denies twitches, tremors Discussed to work on [employer] and weight maintenance Brief psychotic disorder; improved, lower risperdal to 1mg  Avoid alcohol as well  Fu 2.m  , MD 3/11/202212:13 PM

## 2020-04-09 ENCOUNTER — Other Ambulatory Visit (HOSPITAL_COMMUNITY): Payer: Self-pay | Admitting: Psychiatry

## 2020-05-06 ENCOUNTER — Telehealth (HOSPITAL_COMMUNITY): Payer: BC Managed Care – PPO | Admitting: Psychiatry

## 2020-05-13 ENCOUNTER — Telehealth (INDEPENDENT_AMBULATORY_CARE_PROVIDER_SITE_OTHER): Payer: BC Managed Care – PPO | Admitting: Psychiatry

## 2020-05-13 ENCOUNTER — Encounter (HOSPITAL_COMMUNITY): Payer: Self-pay | Admitting: Psychiatry

## 2020-05-13 DIAGNOSIS — F12959 Cannabis use, unspecified with psychotic disorder, unspecified: Secondary | ICD-10-CM | POA: Diagnosis not present

## 2020-05-13 DIAGNOSIS — F23 Brief psychotic disorder: Secondary | ICD-10-CM | POA: Diagnosis not present

## 2020-05-13 NOTE — Progress Notes (Signed)
[facility] Follow up visit  Patient Identification: Kenneth Griffith MRN:  [id number] Date of Evaluation:  05/13/2020 Referral Source: [facility] discharge Chief Complaint:   follow up psychosis  Visit Diagnosis:    ICD-10-CM   1. Cannabis-induced psychotic disorder (HCC)  F12.959   2. Brief psychotic disorder [facility])  F23     Virtual Visit via Telephone Note  I connected with Kenneth Griffith on 05/13/20 at  1:00 PM EDT by telephone and verified that I am speaking with the correct person using two identifiers.  Location: Patient: home Provider: home office   I discussed the limitations, risks, security and privacy concerns of performing an evaluation and management service by telephone and the availability of in person appointments. I also discussed with the patient that there may be a patient responsible charge related to this service. The patient expressed understanding and agreed to proceed.      I discussed the assessment and treatment plan with the patient. The patient was provided an opportunity to ask questions and all were answered. The patient agreed with the plan and demonstrated an understanding of the instructions.   The patient was advised to call back or seek an in-person evaluation if the symptoms worsen or if the condition fails to improve as anticipated.  I provided 11  minutes of non-face-to-face time during this encounter including documentation.     History of Present Illness: Patient is a 20 years old currently single Caucasian male living with his mom and stepdad initially referred after discharged from behavioral health unit he was admitted for 5 days for apparent bizarre and odd behavior including being belligerent diagnosed with psychotic disorder unspecified or brief psychotic disorder.  He was using THC    He continues to do well last visit we have is to use the Resporal to 1 mg at night Gradually has been tapering it down He has been off medication for the last 1  week Remains calm no agitation does not endorse paranoia or hallucinations parents are aware that he is not on medication for the last 1 week and continues to do better   Aggravating factors; recent hospital admission marijuana use at times difficult relationship with dad and stepdad with yelling as per history  Modifying factors; mom, friends  Dad denies of any knowledge of pscyh history  Past Psychiatric History: denies  Previous Psychotropic Medications: No   Substance Abuse History in the last 12 months:  Yes.    Consequences of Substance Abuse: psychosis related to drugs and depression, impaired judjement  Past Medical History:  Past Medical History:  Diagnosis Date  . Bradycardia    had cardiology workup in 2013:  "structurally normal heart"  . Food allergy    Tree Nuts  . Gastroesophageal reflux    no current med.  . Seasonal allergies   . Thumb fracture 04/2014   right    Past Surgical History:  Procedure Laterality Date  . ADENOIDECTOMY    . OPEN REDUCTION INTERNAL FIXATION (ORIF) METACARPAL Right 05/04/2014   Procedure: OPEN REDUCTION INTERNAL FIXATION (ORIF) RIGHT THUMB ;  Surgeon: Cindee Salt, MD;  Location: [facility];  Service: Orthopedics;  Laterality: Right;  . PERCUTANEOUS PINNING  04/11/2011   Procedure: PERCUTANEOUS PINNING EXTREMITY;  Surgeon: Nicki Reaper, MD;  Location: [facility];  Service: Orthopedics;  Laterality: Right;  PERCUTANEOUS PINNING RIGHT LITTLE AND RING FINGERS  . TONSILLECTOMY  age 48 yrs.  . TYMPANOSTOMY TUBE PLACEMENT  as an  infant    Family Psychiatric History: denies  Family History:  Family History  Problem Relation Age of Onset  . Diabetes Maternal Grandfather   . Kidney disease Maternal Grandfather        not on dialysis yet    Social History:   Social History   Socioeconomic History  . Marital status: Single    Spouse name: Not on file  . Number of children: Not on file  . Years of  education: Not on file  . Highest education level: Not on file  Occupational History  . Not on file  Tobacco Use  . Smoking status: Former Games developer  . Smokeless tobacco: Never Used  Vaping Use  . Vaping Use: Former  Substance and Sexual Activity  . Alcohol use: Yes  . Drug use: Yes    Types: Marijuana  . Sexual activity: Not on file  Other Topics Concern  . Not on file  Social History Narrative  . Not on file   Social Determinants of Health   Financial Resource Strain: Not on file  Food Insecurity: Not on file  Transportation Needs: Not on file  Physical Activity: Not on file  Stress: Not on file  Social Connections: Not on file      Allergies:   Allergies  Allergen Reactions  . Tree Extract Anaphylaxis    TREE NUTS    Metabolic Disorder Labs: No results found for: HGBA1C, MPG No results found for: PROLACTIN No results found for: CHOL, TRIG, HDL, CHOLHDL, VLDL, LDLCALC No results found for: TSH  Therapeutic Level Labs: No results found for: LITHIUM No results found for: CBMZ No results found for: VALPROATE  Current Medications: Current Outpatient Medications  Medication Sig Dispense Refill  . AUVI-Q 0.3 MG/0.3ML SOAJ injection Use as directed for life-threatening allergic reaction. 4 Device 3  . benzocaine (ORAJEL) 10 % mucosal gel Use as directed in the mouth or throat 3 (three) times daily as needed for mouth pain (for oral pain, oral blister on tongue). 5.3 g 0  . loratadine (CLARITIN) 10 MG tablet Take 10 mg by mouth daily as needed for allergies.    . nicotine (NICODERM CQ - DOSED IN MG/24 HOURS) 21 mg/24hr patch Place 1 patch (21 mg total) onto the skin daily. 28 patch 0  . risperiDONE (RISPERDAL) 1 MG tablet Take 1 tablet (1 mg total) by mouth daily. 30 tablet 0   No current facility-administered medications for this visit.      Psychiatric Specialty Exam: Review of Systems  Psychiatric/Behavioral: Negative for agitation and hallucinations.     There were no vitals taken for this visit.There is no height or weight on file to calculate BMI.  General Appearance:  Eye Contact:  Speech:  Normal Rate  Volume:  Normal  Mood: fair  Affect:  Congruent  Thought Process:  Goal Directed  Orientation:  Full (Time, Place, and Person)  Thought Content:  Logical  Suicidal Thoughts:  No  Homicidal Thoughts:  No  Memory:  Immediate;   Fair Recent;   Fair  Judgement:  Fair  Insight:  Shallow  Psychomotor Activity:  Normal  Concentration:  Concentration: Fair and Attention Span: Fair  Recall:  Poor  Fund of Knowledge:Good  Language: Fair  Akathisia:  No  Handed:    AIMS (if indicated): no involuntary movements  Assets:  Desire for Improvement Physical Health Social Support  ADL's:  Intact  Cognition: WNL  Sleep:  Fair   Screenings: AIMS   Flowsheet  Row [facility]  AIMS Total Score 0    AUDIT   Flowsheet Row Admission (Discharged) [facility]  Alcohol Use Disorder Identification Test Final Score (AUDIT) 3    PHQ2-9   Flowsheet Row Video Visit from 03/18/2020 in [facility] AT [place]  PHQ-2 Total Score 0    Flowsheet Row Video Visit from 05/13/2020 in [facility] AT [place] Video Visit from 03/18/2020 in [facility] AT [place] Admission (Discharged) from [facility]  C-SSRS RISK CATEGORY No Risk No Risk No Risk     Prior documentation, copy reviewed  Assessment and Plan: as follows Prior documentation reviewed   Cannabinoid induced psychotic disorder; continues to do fair not using marijuana does not endorse paranoia    Brief psychotic disorder; improved and no psychosis is present more alert not on Resporal for the last 1 week he wants to remain off medication  parents are aware  follow-up in 1 month or 6 weeks  He understands risk of using marijuana and psychosis associated with it remains compliant with not using and also more alert without medication no paranoia   Thresa Ross, MD 5/6/20221:08 PM

## 2020-07-15 ENCOUNTER — Telehealth (INDEPENDENT_AMBULATORY_CARE_PROVIDER_SITE_OTHER): Payer: BC Managed Care – PPO | Admitting: Psychiatry

## 2020-07-15 ENCOUNTER — Encounter (HOSPITAL_COMMUNITY): Payer: Self-pay | Admitting: Psychiatry

## 2020-07-15 DIAGNOSIS — F12959 Cannabis use, unspecified with psychotic disorder, unspecified: Secondary | ICD-10-CM

## 2020-07-15 DIAGNOSIS — F23 Brief psychotic disorder: Secondary | ICD-10-CM | POA: Diagnosis not present

## 2020-07-15 NOTE — Progress Notes (Signed)
[facility] Follow up visit  Patient Identification: Kenneth Griffith MRN:  [id number] Date of Evaluation:  07/15/2020 Referral Source: [facility] discharge Chief Complaint:   follow up psychosis  Visit Diagnosis:    ICD-10-CM   1. Brief psychotic disorder (HCC)  F23     2. Cannabis-induced psychotic disorder [facility])  F12.959      Virtual Visit via Telephone Note  I connected with [name] on 07/15/20 at 11:00 AM EDT by telephone and verified that I am speaking with the correct person using two identifiers.  Location: Patient: home Provider: home office   I discussed the limitations, risks, security and privacy concerns of performing an evaluation and management service by telephone and the availability of in person appointments. I also discussed with the patient that there may be a patient responsible charge related to this service. The patient expressed understanding and agreed to proceed.     I discussed the assessment and treatment plan with the patient. The patient was provided an opportunity to ask questions and all were answered. The patient agreed with the plan and demonstrated an understanding of the instructions.   The patient was advised to call back or seek an in-person evaluation if the symptoms worsen or if the condition fails to improve as anticipated.  I provided 15 minutes of non-face-to-face time during this encounter. Including chart review and documentation    History of Present Illness: Patient is a 20 years old currently single Caucasian male living with his mom and stepdad initially referred after discharged from behavioral health unit he was admitted for 5 days for apparent bizarre and odd behavior including being belligerent diagnosed with psychotic disorder unspecified or brief psychotic disorder.  He was using THC   He continues to do well without buspirone now has been tapered off and discontinued for the last 2 months no paranoia recurrence of psychotic-like symptoms  he keeps himself engaged and is working according to him his parents also thinking that he is doing reasonable and he feels more clear not using marijuana    Aggravating factors; past hospital admission marijuana use at times difficult relationship with dad and stepdad with yelling as per history  Modifying factors; mom, friends  Dad denies of any knowledge of pscyh history  Past Psychiatric History: denies  Previous Psychotropic Medications: No   Substance Abuse History in the last 12 months:  Yes.    Consequences of Substance Abuse: psychosis related to drugs and depression, impaired judjement  Past Medical History:  Past Medical History:  Diagnosis Date   Bradycardia    had cardiology workup in 2013:  "structurally normal heart"   Food allergy    Tree Nuts   Gastroesophageal reflux    no current med.   Seasonal allergies    Thumb fracture 04/2014   right    Past Surgical History:  Procedure Laterality Date   ADENOIDECTOMY     OPEN REDUCTION INTERNAL FIXATION (ORIF) METACARPAL Right 05/04/2014   Procedure: OPEN REDUCTION INTERNAL FIXATION (ORIF) RIGHT THUMB ;  Surgeon: Cindee Salt, MD;  Location: [facility];  Service: Orthopedics;  Laterality: Right;   PERCUTANEOUS PINNING  04/11/2011   Procedure: PERCUTANEOUS PINNING EXTREMITY;  Surgeon: Nicki Reaper, MD;  Location: [facility];  Service: Orthopedics;  Laterality: Right;  PERCUTANEOUS PINNING RIGHT LITTLE AND RING FINGERS   TONSILLECTOMY  age 80 yrs.   TYMPANOSTOMY TUBE PLACEMENT  as an infant    Family Psychiatric History: denies  Family  History:  Family History  Problem Relation Age of Onset   Diabetes Maternal Grandfather    Kidney disease Maternal Grandfather        not on dialysis yet    Social History:   Social History   Socioeconomic History   Marital status: Single    Spouse name: Not on file   Number of children: Not on file   Years of education: Not on file   Highest  education level: Not on file  Occupational History   Not on file  Tobacco Use   Smoking status: Former    Pack years: 0.00   Smokeless tobacco: Never  Vaping Use   Vaping Use: Former  Substance and Sexual Activity   Alcohol use: Yes   Drug use: Yes    Types: Marijuana   Sexual activity: Not on file  Other Topics Concern   Not on file  Social History Narrative   Not on file   Social Determinants of Health   Financial Resource Strain: Not on file  Food Insecurity: Not on file  Transportation Needs: Not on file  Physical Activity: Not on file  Stress: Not on file  Social Connections: Not on file      Allergies:   Allergies  Allergen Reactions   Tree Extract Anaphylaxis    TREE NUTS    Metabolic Disorder Labs: No results found for: HGBA1C, MPG No results found for: PROLACTIN No results found for: CHOL, TRIG, HDL, CHOLHDL, VLDL, LDLCALC No results found for: TSH  Therapeutic Level Labs: No results found for: LITHIUM No results found for: CBMZ No results found for: VALPROATE  Current Medications: Current Outpatient Medications  Medication Sig Dispense Refill   AUVI-Q 0.3 MG/0.3ML SOAJ injection Use as directed for life-threatening allergic reaction. 4 Device 3   benzocaine (ORAJEL) 10 % mucosal gel Use as directed in the mouth or throat 3 (three) times daily as needed for mouth pain (for oral pain, oral blister on tongue). 5.3 g 0   loratadine (CLARITIN) 10 MG tablet Take 10 mg by mouth daily as needed for allergies.     nicotine (NICODERM CQ - DOSED IN MG/24 HOURS) 21 mg/24hr patch Place 1 patch (21 mg total) onto the skin daily. 28 patch 0   No current facility-administered medications for this visit.      Psychiatric Specialty Exam: Review of Systems  Psychiatric/Behavioral:  Negative for agitation and hallucinations.    There were no vitals taken for this visit.There is no height or weight on file to calculate BMI.  General Appearance:  Eye Contact:   Speech:  Normal Rate  Volume:  Normal  Mood: fair  Affect:  Congruent  Thought Process:  Goal Directed  Orientation:  Full (Time, Place, and Person)  Thought Content:  Logical  Suicidal Thoughts:  No  Homicidal Thoughts:  No  Memory:  Immediate;   Fair Recent;   Fair  Judgement:  Fair  Insight:  Shallow  Psychomotor Activity:  Normal  Concentration:  Concentration: Fair and Attention Span: Fair  Recall:  Poor  Fund of Knowledge:Good  Language: Fair  Akathisia:  No  Handed:    AIMS (if indicated): no involuntary movements  Assets:  Desire for Improvement Physical Health Social Support  ADL's:  Intact  Cognition: WNL  Sleep:  Fair   Screenings: AIMS    Flowsheet Row [facility]  AIMS Total Score 0      AUDIT  Flowsheet Row [facility]  Alcohol Use Disorder Identification Test Final Score (AUDIT) 3      PHQ2-9    Flowsheet Row Video Visit from 03/18/2020 in [facility] [place]  PHQ-2 Total Score 0      Flowsheet Row Video Visit from 07/15/2020 in [facility] [place] Video Visit from 05/13/2020 in [facility] AT [place] Video Visit from 03/18/2020 in [facility] AT [place]  C-SSRS RISK CATEGORY No Risk No Risk No Risk     Prior documentation reviewed  Assessment and Plan: as follows    Cannabinoid induced psychotic disorder; remains fair and sober no current psychotic-like symptoms not on Risperdal anymore   Brief psychotic disorder; improved with no psychosis not on medication discussed sobriety  Follow-up in 3 months or earlier if needed  Thresa Ross, MD 7/8/202211:17 AM

## 2020-10-17 ENCOUNTER — Telehealth (HOSPITAL_COMMUNITY): Payer: BC Managed Care – PPO | Admitting: Psychiatry

## 2020-11-06 ENCOUNTER — Other Ambulatory Visit: Payer: Self-pay

## 2020-11-06 ENCOUNTER — Encounter (HOSPITAL_BASED_OUTPATIENT_CLINIC_OR_DEPARTMENT_OTHER): Payer: Self-pay

## 2020-11-06 ENCOUNTER — Emergency Department (HOSPITAL_COMMUNITY)
Admission: EM | Admit: 2020-11-06 | Discharge: 2020-11-06 | Discharge status: Against Medical Advice | Payer: BC Managed Care – PPO

## 2020-11-06 ENCOUNTER — Emergency Department (HOSPITAL_BASED_OUTPATIENT_CLINIC_OR_DEPARTMENT_OTHER)
Admission: EM | Admit: 2020-11-06 | Discharge: 2020-11-06 | Disposition: A | Discharge status: Home / Self Care | Payer: BC Managed Care – PPO | Attending: Emergency Medicine | Admitting: Emergency Medicine

## 2020-11-06 DIAGNOSIS — G259 Extrapyramidal and movement disorder, unspecified: Secondary | ICD-10-CM

## 2020-11-06 DIAGNOSIS — T40715A Adverse effect of cannabis, initial encounter: Secondary | ICD-10-CM | POA: Diagnosis present

## 2020-11-06 DIAGNOSIS — F12988 Cannabis use, unspecified with other cannabis-induced disorder: Secondary | ICD-10-CM | POA: Diagnosis not present

## 2020-11-06 DIAGNOSIS — R269 Unspecified abnormalities of gait and mobility: Secondary | ICD-10-CM | POA: Insufficient documentation

## 2020-11-06 DIAGNOSIS — R4182 Altered mental status, unspecified: Secondary | ICD-10-CM | POA: Insufficient documentation

## 2020-11-06 DIAGNOSIS — R462 Strange and inexplicable behavior: Secondary | ICD-10-CM | POA: Diagnosis not present

## 2020-11-06 DIAGNOSIS — G2401 Drug induced subacute dyskinesia: Secondary | ICD-10-CM | POA: Insufficient documentation

## 2020-11-06 DIAGNOSIS — R220 Localized swelling, mass and lump, head: Secondary | ICD-10-CM | POA: Diagnosis not present

## 2020-11-06 DIAGNOSIS — Z87891 Personal history of nicotine dependence: Secondary | ICD-10-CM | POA: Diagnosis not present

## 2020-11-06 LAB — URINALYSIS, ROUTINE W REFLEX MICROSCOPIC
Bilirubin Urine: NEGATIVE
Glucose, UA: NEGATIVE mg/dL
Hgb urine dipstick: NEGATIVE
Ketones, ur: NEGATIVE mg/dL
Leukocytes,Ua: NEGATIVE
Nitrite: NEGATIVE
Protein, ur: NEGATIVE mg/dL
Specific Gravity, Urine: <1.005 — ABNORMAL LOW (ref 1.005–1.030)
pH: 7 (ref 5.0–8.0)

## 2020-11-06 LAB — CBC
HCT: 41.1 % (ref 39.0–52.0)
Hemoglobin: 14 g/dL (ref 13.0–17.0)
MCH: 30.8 pg (ref 26.0–34.0)
MCHC: 34.1 g/dL (ref 30.0–36.0)
MCV: 90.3 fL (ref 80.0–100.0)
Platelets: 249 10*3/uL (ref 150–400)
RBC: 4.55 MIL/uL (ref 4.22–5.81)
RDW: 12.4 % (ref 11.5–15.5)
WBC: 5.9 10*3/uL (ref 4.0–10.5)
nRBC: 0 % (ref 0.0–0.2)

## 2020-11-06 LAB — COMPREHENSIVE METABOLIC PANEL
ALT: 23 U/L (ref 0–44)
AST: 35 U/L (ref 15–41)
Albumin: 4.6 g/dL (ref 3.5–5.0)
Alkaline Phosphatase: 51 U/L (ref 38–126)
Anion gap: 8 (ref 5–15)
BUN: 7 mg/dL (ref 6–20)
CO2: 23 mmol/L (ref 22–32)
Calcium: 9.4 mg/dL (ref 8.9–10.3)
Chloride: 106 mmol/L (ref 98–111)
Creatinine, Ser: 0.85 mg/dL (ref 0.61–1.24)
GFR, Estimated: >60 mL/min (ref >60)
Glucose, Bld: 87 mg/dL (ref 70–99)
Potassium: 3.9 mmol/L (ref 3.5–5.1)
Sodium: 137 mmol/L (ref 135–145)
Total Bilirubin: 1.3 mg/dL — ABNORMAL HIGH (ref 0.3–1.2)
Total Protein: 6.9 g/dL (ref 6.5–8.1)

## 2020-11-06 LAB — RAPID URINE DRUG SCREEN, HOSP PERFORMED
Amphetamines: NOT DETECTED
Barbiturates: NOT DETECTED
Benzodiazepines: NOT DETECTED
Cocaine: NOT DETECTED
Opiates: NOT DETECTED
Tetrahydrocannabinol: POSITIVE — AB

## 2020-11-06 LAB — ETHANOL: Alcohol, Ethyl (B): <10 mg/dL (ref <10)

## 2020-11-06 MED ORDER — DIPHENHYDRAMINE HCL 50 MG/ML IJ SOLN
50.0000 mg | Freq: Once | INTRAMUSCULAR | Status: AC
  Administered 2020-11-06: 21:00:00 50 mg via INTRAVENOUS
  Filled 2020-11-06: qty 1

## 2020-11-06 NOTE — ED Provider Notes (Signed)
[facility] EMERGENCY DEPT Provider Note   CSN: [id number] Arrival date & time: 11/06/20  1835     History Chief Complaint  Patient presents with   Altered Mental Status   Oral Swelling    Kenneth Griffith is a 20 y.o. male.  Patient presents with bizarre mouth opening, increased elevation and bizarre gait.  Family states that he had a history of psychotic break a year ago and was on respite all at the time.  He has been weaned off about 3 months ago had been doing well until recently.  He was out with his girlfriend 3 days ago and use marijuana.  Subsequently had increased bizarre behavior.  Family gave him respite all again initially 4 mg dosage and then 2 mg doses.  The next day he was received again 2 mg doses and then 2 mg doses.  And today he received additional 2 mg doses x2.  After the second 2 mg dose the patient started to have increased drooling mouth opening and bizarre gait.  However his behavioral outbursts had resolved.  No reports of fevers or cough or vomiting or diarrhea.  Patient himself denies any headache or chest pain or abdominal pain no difficulty breathing reported.      Past Medical History:  Diagnosis Date   Bradycardia    had cardiology workup in 2013:  "structurally normal heart"   Food allergy    Tree Nuts   Gastroesophageal reflux    no current med.   Seasonal allergies    Thumb fracture 04/2014   right    Patient Active Problem List   Diagnosis Date Noted   Psychosis (HCC) 10/19/2019   Cannabis-induced psychotic disorder (HCC)    Brief psychotic disorder (HCC)    Anaphylaxis due to tree nut 08/04/2015   Perennial allergic rhinitis 08/04/2015   Other seasonal allergic rhinitis 08/04/2015   Erosion of teeth, limited to enamel 08/14/2010   Gastroesophageal reflux     Past Surgical History:  Procedure Laterality Date   ADENOIDECTOMY     OPEN REDUCTION INTERNAL FIXATION (ORIF) METACARPAL Right 05/04/2014   Procedure: OPEN  REDUCTION INTERNAL FIXATION (ORIF) RIGHT THUMB ;  Surgeon: Cindee Salt, MD;  Location: [facility];  Service: Orthopedics;  Laterality: Right;   PERCUTANEOUS PINNING  04/11/2011   Procedure: PERCUTANEOUS PINNING EXTREMITY;  Surgeon: Nicki Reaper, MD;  Location: [facility];  Service: Orthopedics;  Laterality: Right;  PERCUTANEOUS PINNING RIGHT LITTLE AND RING FINGERS   TONSILLECTOMY  age 16 yrs.   TYMPANOSTOMY TUBE PLACEMENT  as an infant       Family History  Problem Relation Age of Onset   Diabetes Maternal Grandfather    Kidney disease Maternal Grandfather        not on dialysis yet    Social History   Tobacco Use   Smoking status: Former   Smokeless tobacco: Never  Building services engineer Use: Former  Substance Use Topics   Alcohol use: Yes   Drug use: Yes    Types: Marijuana    Home Medications Prior to Admission medications   Medication Sig Start Date End Date Taking? Authorizing Provider  AUVI-Q 0.3 MG/0.3ML SOAJ injection Use as directed for life-threatening allergic reaction. 07/24/16   Kozlow, Alvira Philips, MD  benzocaine (ORAJEL) 10 % mucosal gel Use as directed in the mouth or throat 3 (three) times daily as needed for mouth pain (for oral pain, oral blister on tongue). 10/23/19 11/22/19  Karsten Ro, MD  loratadine (CLARITIN) 10 MG tablet Take 10 mg by mouth daily as needed for allergies.    [provider]  nicotine (NICODERM CQ - DOSED IN MG/24 HOURS) 21 mg/24hr patch Place 1 patch (21 mg total) onto the skin daily. 10/24/19   Karsten Ro, MD  risperiDONE (RISPERDAL) 1 MG tablet Take 1 tablet (1 mg total) by mouth daily. 04/12/20 07/15/20  Thresa Ross, MD    Allergies    Tree extract  Review of Systems   Review of Systems  Constitutional:  Negative for fever.  HENT:  Negative for ear pain and sore throat.   Eyes:  Negative for pain.  Respiratory:  Negative for cough.   Cardiovascular:  Negative for chest pain.  Gastrointestinal:   Negative for abdominal pain.  Genitourinary:  Negative for flank pain.  Musculoskeletal:  Negative for back pain.  Skin:  Negative for color change and rash.  Neurological:  Negative for syncope.  All other systems reviewed and are negative.  Physical Exam Updated Vital Signs BP (!) 132/93   Pulse 83   Temp 97.8 F (36.6 C) (Oral)   Resp (!) 22   SpO2 100%   Physical Exam Constitutional:      Appearance: He is well-developed.  HENT:     Head: Normocephalic.     Nose: Nose normal.  Eyes:     Extraocular Movements: Extraocular movements intact.  Cardiovascular:     Rate and Rhythm: Normal rate.  Pulmonary:     Effort: Pulmonary effort is normal.  Skin:    Coloration: Skin is not jaundiced.  Neurological:     General: No focal deficit present.     Mental Status: He is alert and oriented to person, place, and time. Mental status is at baseline.     Cranial Nerves: No cranial nerve deficit.     Motor: No weakness.     Comments: Patient has 5/5 strength all extremities but he does contort his face bizarrely at times.  He opens his mouth wide at times and keeps it open.  Although he is able to close it it appears.  I do not see any tongue swelling or oral swelling.    ED Results / Procedures / Treatments   Labs (all labs ordered are listed, but only abnormal results are displayed) Labs Reviewed  COMPREHENSIVE METABOLIC PANEL - Abnormal; Notable for the following components:      Result Value   Total Bilirubin 1.3 (*)    All other components within normal limits  RAPID URINE DRUG SCREEN, HOSP PERFORMED - Abnormal; Notable for the following components:   Tetrahydrocannabinol POSITIVE (*)    All other components within normal limits  URINALYSIS, ROUTINE W REFLEX MICROSCOPIC - Abnormal; Notable for the following components:   Color, Urine COLORLESS (*)    Specific Gravity, Urine <1.005 (*)    All other components within normal limits  ETHANOL  CBC     EKG None  Radiology No results found.  Procedures Procedures   Medications Ordered in ED Medications  diphenhydrAMINE (BENADRYL) injection 50 mg (50 mg Intravenous Given 11/06/20 2035)    ED Course  I have reviewed the triage vital signs and the nursing notes.  Pertinent labs & imaging results that were available during my care of the patient were reviewed by me and considered in my medical decision making (see chart for details).    MDM Rules/Calculators/A&P  Clinically suspect extraparametal symptoms.  Labs are sent is unremarkable.  Patient given IV Benadryl with subsequent resolution of symptoms.  He observed here for some time with no additional adverse events feeling much better.  He remains awake and alert and cooperative and appropriate, family at bedside.  Patient discharged home, advised stopping respite all, advised following up with his psychiatrist within the next 2 to 3 days, advised additional Benadryl as needed at home should his symptoms return.  Advised immediate return back to the ER if his symptoms do not improve or if have any additional concerns.  Final Clinical Impression(s) / ED Diagnoses Final diagnoses:  Extrapyramidal reaction    Rx / DC Orders ED Discharge Orders     None        Cheryll Cockayne, MD 11/06/20 2105

## 2020-11-06 NOTE — ED Triage Notes (Signed)
Pt took some marijuana Thursday night while at a party. Girlfriend called his dad d/t pt "not acting right". Pt had a similar incident happen last year, prescribed Risperdal and admitted to [facility].  Step dad in triage describes his symptoms are concerning for metabolic issues. He is a physician himself. Pt has taken a total of 7mg  Risperdal yesterday, took 2 mg at 0300, 2mg  at 1630 d/t feeling extremely anxious and agitated. Shortly after the 1630 dose the pt's began swelling swelling, pt unable to wear a face mask, increase saliva in triage, pt sitting with his tongue sticking out. Pt also had cramping to BLE after 0300 dose, family gave him Bananas, oranges and magnesium supplement. Pt very restless in triage, pt does have thick speech, no swelling noted to throat, per family and pt his tongue is larger than usual.  Pt has not had a lot of sleep since Thursday.

## 2020-11-06 NOTE — Discharge Instructions (Addendum)
Avoid Risperdal.  Follow-up with your psychiatrist in the next 2 to 3 days.  Take Benadryl as needed 25 mg to 50 mg doses every 8 hours.  Return back to the ER if your symptoms do not improve or if you have any additional concerns.

## 2020-11-11 ENCOUNTER — Encounter (HOSPITAL_COMMUNITY): Payer: Self-pay | Admitting: Psychiatry

## 2020-11-11 ENCOUNTER — Telehealth (INDEPENDENT_AMBULATORY_CARE_PROVIDER_SITE_OTHER): Payer: BC Managed Care – PPO | Admitting: Psychiatry

## 2020-11-11 DIAGNOSIS — F23 Brief psychotic disorder: Secondary | ICD-10-CM

## 2020-11-11 DIAGNOSIS — F12959 Cannabis use, unspecified with psychotic disorder, unspecified: Secondary | ICD-10-CM

## 2020-11-11 NOTE — Progress Notes (Signed)
[facility] Follow up visit  Patient Identification: Kenneth Griffith MRN:  [id number] Date of Evaluation:  11/11/2020 Referral Source: [facility] discharge Chief Complaint:   follow upmed review Visit Diagnosis:    ICD-10-CM   1. Brief psychotic disorder (HCC)  F23     2. Cannabis-induced psychotic disorder [facility])  F12.959      Virtual Visit via Video Note  I connected with Kenneth Griffith on 11/11/20 at  8:30 AM EDT by a video enabled telemedicine application and verified that I am speaking with the correct person using two identifiers.  Location: Patient: home with mom Provider: home office   I discussed the limitations of evaluation and management by telemedicine and the availability of in person appointments. The patient expressed understanding and agreed to proceed.    I discussed the assessment and treatment plan with the patient. The patient was provided an opportunity to ask questions and all were answered. The patient agreed with the plan and demonstrated an understanding of the instructions.   The patient was advised to call back or seek an in-person evaluation if the symptoms worsen or if the condition fails to improve as anticipated.  I provided 20 minutes of non-face-to-face time during this encounter.     History of Present Illness: Patient is a 20 years old currently single Caucasian male living with his mom and stepdad initially referred after discharged from behavioral health unit he was admitted for 5 days for apparent bizarre and odd behavior including being belligerent diagnosed with psychotic disorder unspecified or brief psychotic disorder.  He was using THC   Has been doing reasonable till last 2 weeks and has been tapered off risperdal since this year as per request by him and family from risperdal  Had incident last week GF called his mom, was acting out bizzarre and has used THC again. Mom gave him 4mg  risperdal for him to sleep and then repeated 2mg  and 1mg  next day he had  tremors, shakes and was taken to ER given benadryl for EPS  He is doing better  now, sleep ok, does not want to be on risperdal, he understands and mom understands he is an adult and we cannot force meds  He promises to not uses THC and understands what it can do to him   Discussed with mom in detail, she understands he is an adult and has to take consequences in his hand , he does not want to follow up for med review as he doesn't want to be on meds either  Mood wise doing fair, not psychotic as of now     Aggravating factors; past hospital admission marijuana use at times difficult relationship with dad and stepdad with yelling as per history  Modifying factors; mom, friends  Dad denies of any knowledge of pscyh history  Past Psychiatric History: denies  Previous Psychotropic Medications: No   Substance Abuse History in the last 12 months:  Yes.    Consequences of Substance Abuse: psychosis related to drugs and depression, impaired judjement  Past Medical History:  Past Medical History:  Diagnosis Date   Bradycardia    had cardiology workup in 2013:  "structurally normal heart"   Food allergy    Tree Nuts   Gastroesophageal reflux    no current med.   Seasonal allergies    Thumb fracture 04/2014   right    Past Surgical History:  Procedure Laterality Date   ADENOIDECTOMY     OPEN REDUCTION INTERNAL FIXATION (ORIF)  METACARPAL Right 05/04/2014   Procedure: OPEN REDUCTION INTERNAL FIXATION (ORIF) RIGHT THUMB ;  Surgeon: Cindee Salt, MD;  Location: [facility];  Service: Orthopedics;  Laterality: Right;   PERCUTANEOUS PINNING  04/11/2011   Procedure: PERCUTANEOUS PINNING EXTREMITY;  Surgeon: Nicki Reaper, MD;  Location:  SURGERY CENTER;  Service: Orthopedics;  Laterality: Right;  PERCUTANEOUS PINNING RIGHT LITTLE AND RING FINGERS   TONSILLECTOMY  age 44 yrs.   TYMPANOSTOMY TUBE PLACEMENT  as an infant    Family Psychiatric History: denies  Family  History:  Family History  Problem Relation Age of Onset   Diabetes Maternal Grandfather    Kidney disease Maternal Grandfather        not on dialysis yet    Social History:   Social History   Socioeconomic History   Marital status: Single    Spouse name: Not on file   Number of children: Not on file   Years of education: Not on file   Highest education level: Not on file  Occupational History   Not on file  Tobacco Use   Smoking status: Former   Smokeless tobacco: Never  Vaping Use   Vaping Use: Former  Substance and Sexual Activity   Alcohol use: Yes   Drug use: Yes    Types: Marijuana   Sexual activity: Not on file  Other Topics Concern   Not on file  Social History Narrative   Not on file   Social Determinants of Health   Financial Resource Strain: Not on file  Food Insecurity: Not on file  Transportation Needs: Not on file  Physical Activity: Not on file  Stress: Not on file  Social Connections: Not on file      Allergies:   Allergies  Allergen Reactions   Tree Extract Anaphylaxis    TREE NUTS    Metabolic Disorder Labs: No results found for: HGBA1C, MPG No results found for: PROLACTIN No results found for: CHOL, TRIG, HDL, CHOLHDL, VLDL, LDLCALC No results found for: TSH  Therapeutic Level Labs: No results found for: LITHIUM No results found for: CBMZ No results found for: VALPROATE  Current Medications: Current Outpatient Medications  Medication Sig Dispense Refill   AUVI-Q 0.3 MG/0.3ML SOAJ injection Use as directed for life-threatening allergic reaction. 4 Device 3   benzocaine (ORAJEL) 10 % mucosal gel Use as directed in the mouth or throat 3 (three) times daily as needed for mouth pain (for oral pain, oral blister on tongue). 5.3 g 0   loratadine (CLARITIN) 10 MG tablet Take 10 mg by mouth daily as needed for allergies.     nicotine (NICODERM CQ - DOSED IN MG/24 HOURS) 21 mg/24hr patch Place 1 patch (21 mg total) onto the skin daily. 28  patch 0   No current facility-administered medications for this visit.      Psychiatric Specialty Exam: Review of Systems  Psychiatric/Behavioral:  Negative for agitation and hallucinations.    There were no vitals taken for this visit.There is no height or weight on file to calculate BMI.  General Appearance:causla  Eye Contact:fair  Speech:  Normal Rate  Volume:  Normal  Mood: fair  Affect:  Congruent  Thought Process:  Goal Directed  Orientation:  Full (Time, Place, and Person)  Thought Content:  Logical  Suicidal Thoughts:  No  Homicidal Thoughts:  No  Memory:  Immediate;   Fair Recent;   Fair  Judgement:  Fair  Insight:  Shallow  Psychomotor Activity:  Normal  Concentration:  Concentration: Fair and Attention Span: Fair  Recall:  Poor  Fund of Knowledge:Good  Language: Fair  Akathisia:  No  Handed:    AIMS (if indicated): no involuntary movements  Assets:  Desire for Improvement Physical Health Social Support  ADL's:  Intact  Cognition: WNL  Sleep:  Fair   Screenings: AIMS    Flowsheet Row [facility]  AIMS Total Score 0      AUDIT    Flowsheet Row Ad[facility] (Discharged[facility]  Alcohol Use Disorder Identification Test Final Score (AUDIT) 3      PHQ2-9    Flowsheet Row Video Visit from 03/18/2020 in [facility] [place]  PHQ-2 Total Score 0      Flowsheet Row Video Visit from 11/11/2020 in [facility] [place] ED from 11/06/2020 in [facility] Video Visit from 07/15/2020 in [facility] AT [place]  C-SSRS RISK CATEGORY No Risk No Risk No Risk     Prior documentation reviewed  Assessment and Plan: as follows    Cannabinoid induced psychotic disorder; remains stable without use but had recurrence  of behaviour disturbance after use, he does not want ot use THC and does not want to be on meds eihter, discussed with mom and understands he would not need med review as he doesn't comply with our recommendations, best would be to take low dose risperdal regularly for now  Brief psychotic disorder; improves without use of THC  Patient adult and does not want to be on meds, can be discharged, understands the effect of tHC and risk of use  Discussed sobriety and substance use programs can be joined, but he promises not to use and does not want be in treatment  FU with ER if needed Can be DC as requested and mom understands from the clinic as he doesn't want follow ups.   Thresa Ross, MD 11/4/20228:56 AM

## 2020-11-12 ENCOUNTER — Other Ambulatory Visit: Payer: Self-pay

## 2020-11-12 ENCOUNTER — Ambulatory Visit (HOSPITAL_COMMUNITY)
Admission: EM | Admit: 2020-11-12 | Discharge: 2020-11-13 | Disposition: A | Discharge status: Home / Self Care | Payer: BC Managed Care – PPO | Attending: Family | Admitting: Family

## 2020-11-12 DIAGNOSIS — Z79899 Other long term (current) drug therapy: Secondary | ICD-10-CM | POA: Insufficient documentation

## 2020-11-12 DIAGNOSIS — G4700 Insomnia, unspecified: Secondary | ICD-10-CM | POA: Insufficient documentation

## 2020-11-12 DIAGNOSIS — F39 Unspecified mood [affective] disorder: Secondary | ICD-10-CM | POA: Insufficient documentation

## 2020-11-12 DIAGNOSIS — Z20822 Contact with and (suspected) exposure to covid-19: Secondary | ICD-10-CM | POA: Insufficient documentation

## 2020-11-12 DIAGNOSIS — T43595A Adverse effect of other antipsychotics and neuroleptics, initial encounter: Secondary | ICD-10-CM | POA: Insufficient documentation

## 2020-11-12 DIAGNOSIS — F308 Other manic episodes: Secondary | ICD-10-CM | POA: Insufficient documentation

## 2020-11-12 LAB — RESP PANEL BY RT-PCR (FLU A&B, COVID) ARPGX2
Influenza A by PCR: NEGATIVE
Influenza B by PCR: NEGATIVE
SARS Coronavirus 2 by RT PCR: NEGATIVE

## 2020-11-12 LAB — POCT URINE DRUG SCREEN - MANUAL ENTRY (I-SCREEN)
POC Amphetamine UR: NOT DETECTED
POC Buprenorphine (BUP): NOT DETECTED
POC Cocaine UR: NOT DETECTED
POC Marijuana UR: POSITIVE — AB
POC Methadone UR: NOT DETECTED
POC Methamphetamine UR: NOT DETECTED
POC Morphine: NOT DETECTED
POC Oxazepam (BZO): NOT DETECTED
POC Oxycodone UR: NOT DETECTED
POC Secobarbital (BAR): NOT DETECTED

## 2020-11-12 LAB — POC SARS CORONAVIRUS 2 AG -  ED: SARS Coronavirus 2 Ag: NEGATIVE

## 2020-11-12 MED ORDER — NICOTINE 21 MG/24HR TD PT24
21.0000 mg | MEDICATED_PATCH | Freq: Every day | TRANSDERMAL | Status: DC
  Administered 2020-11-12: 21 mg via TRANSDERMAL

## 2020-11-12 MED ORDER — LORAZEPAM 1 MG PO TABS
1.0000 mg | ORAL_TABLET | Freq: Once | ORAL | Status: AC
  Administered 2020-11-12: 1 mg via ORAL
  Filled 2020-11-12: qty 1

## 2020-11-12 MED ORDER — MAGNESIUM HYDROXIDE 400 MG/5ML PO SUSP: 30.0000 mL | Freq: Every day | ORAL | Status: DC | PRN

## 2020-11-12 MED ORDER — NICOTINE 21 MG/24HR TD PT24
MEDICATED_PATCH | TRANSDERMAL | Status: AC
  Filled 2020-11-12: qty 1

## 2020-11-12 MED ORDER — RISPERIDONE 2 MG PO TABS
2.0000 mg | ORAL_TABLET | Freq: Every day | ORAL | Status: DC
  Administered 2020-11-12: 2 mg via ORAL
  Filled 2020-11-12: qty 1

## 2020-11-12 MED ORDER — ALUM & MAG HYDROXIDE-SIMETH 200-200-20 MG/5ML PO SUSP: 30.0000 mL | ORAL | Status: DC | PRN

## 2020-11-12 MED ORDER — BENZTROPINE MESYLATE 1 MG PO TABS
1.0000 mg | ORAL_TABLET | Freq: Every day | ORAL | Status: DC
  Administered 2020-11-12: 1 mg via ORAL
  Filled 2020-11-12: qty 1

## 2020-11-12 MED ORDER — TRAZODONE HCL 50 MG PO TABS
50.0000 mg | ORAL_TABLET | Freq: Every evening | ORAL | Status: AC | PRN
  Administered 2020-11-12: 50 mg via ORAL
  Filled 2020-11-12: qty 1

## 2020-11-12 MED ORDER — ACETAMINOPHEN 325 MG PO TABS: 650.0000 mg | ORAL_TABLET | Freq: Four times a day (QID) | ORAL | Status: DC | PRN

## 2020-11-12 NOTE — ED Notes (Signed)
Pt sleeping at present, no distress noted.  Monitoring for safety. 

## 2020-11-12 NOTE — ED Notes (Signed)
Pt A&O x 4, irritable & restless, requesting to be discharged.  Staff explained to pt that he would be reassessed in am.  Resting at present.  No distress noted.  Monitoring for safety.

## 2020-11-12 NOTE — ED Notes (Signed)
Patient brought onto unit given meal. Patient somewhat agitated but cooperative.

## 2020-11-12 NOTE — Progress Notes (Signed)
Admission Note.  Pt skin assessment was performed prior to entry to obs unit.  Clothing with strings and ligature risks were removed and locked in #27.  Pt was placed in chair 4.  He is Alert and oriented x 4. Guarded affect.  He appears suspicious and irritable but takes redirecting and follows commands with prompting.  Pt denies SI, HI and AH at this time.  This staff person is Unable to ascertain if he there is any  RIS at this time due to his guarded nature. He was given sandwich and drink.  Informed that he also needs to give a UDS sample.  He stated he would try shortly.   No further distress noted.

## 2020-11-12 NOTE — Discharge Instructions (Signed)
Take all medications as prescribed. Keep all follow-up appointments as scheduled.  Do not consume alcohol or use illegal drugs while on prescription medications. Report any adverse effects from your medications to your primary care provider promptly.  In the event of recurrent symptoms or worsening symptoms, call 911, a crisis hotline, or go to the nearest emergency department for evaluation.   

## 2020-11-12 NOTE — ED Provider Notes (Addendum)
Behavioral Health Admission [facility])  Date: 11/12/20 Patient Name: Kenneth Griffith MRN: [id number] Chief Complaint:  Chief Complaint  Patient presents with   Medication Problem   Insomnia      Diagnoses:  Final diagnoses:  Hypomania [facility])    HPI:  Kenneth Griffith presents to [facility] urgent care accompanied with his parents.  Reporting a history of psychosis induced mood disorder.  States he was recently titrated from risperidone 4 mg nightly roughly 2 to 4 months ago.  States more recently he has not been" acting his self" reports she has been paranoid, unable to sleep, restlessness and hypervigilant.   [name] is denying suicidal or homicidal ideations.  Denies auditory visual hallucinations.  Family reports Sunday they had administered 4 mg of Risperidone however patient had a dystonic reaction and had to receive IV Benadryl.  States he has not taking any medication since then.  States he has been up for the past 2 days.  Will recommend overnight observation.  Restart risperidone 2 mg p.o. nightly with Cogentin.  Now order for Ativan 1 mg p.o.  Case staffed with attending psychiatrist Akintayo.  Anticipated discharge 11/13/2020  PHQ 2-9:   Flowsheet Row ED from 11/12/2020 in [facility] Video Visit from 11/11/2020 in [facility] [place] ED from 11/06/2020 in [facility]  C-SSRS RISK CATEGORY No Risk No Risk No Risk        Total Time spent with patient: within normal limits  Musculoskeletal  Strength & Muscle Tone: within normal limits Gait & Station: normal Patient leans: N/A  Psychiatric Specialty Exam  Presentation General Appearance: Appropriate for Environment Eye Contact:Good Speech:Clear and Coherent Speech Volume:Normal Handedness:Right  Mood and Affect  Mood:Anxious Affect:Congruent  Thought Process  Thought Processes:Coherent Descriptions of  Associations:Intact Orientation:Full (Time, Place and Person) Thought Content:Logical; Rumination   Hallucinations:Hallucinations: None Ideas of Reference:None Suicidal Thoughts:Suicidal Thoughts: No Homicidal Thoughts:Homicidal Thoughts: No  Sensorium  Memory:Recent Good; Remote Good Judgment:Good Insight:Good  Executive Functions  Concentration:Fair Attention Span:Good Recall:Good Fund of Knowledge:Good Language:Good  Psychomotor Activity  Psychomotor Activity:Psychomotor Activity: Normal  Assets  Assets:Resilience; Social Support  Sleep  Sleep:Sleep: Fair  Nutritional Assessment (For OBS and [facility]BC admissions only) Has the patient had a weight loss or gain of 10 pounds or more in the last 3 months?: No Has the patient had a decrease in food intake/or appetite?: No Does the patient have dental problems?: No Does the patient have eating habits or behaviors that may be indicators of an eating disorder including binging or inducing vomiting?: No Has the patient recently lost weight without trying?: 0 Has the patient been eating poorly because of a decreased appetite?: 0 Malnutrition Screening Tool Score: 0   Physical Exam Vitals reviewed.  Cardiovascular:     Rate and Rhythm: Normal rate.  Pulmonary:     Effort: Pulmonary effort is normal.  Neurological:     Mental Status: He is alert and oriented to person, place, and time.  Psychiatric:        Attention and Perception: Attention normal.        Mood and Affect: Mood is anxious. Affect is labile.        Speech: Speech normal.        Behavior: Behavior normal.        Thought Content: Thought content normal.        Cognition and Memory: Cognition normal.        Judgment: Judgment  normal.   ROS  Blood pressure 135/87, pulse 93, temperature 98.5 F (36.9 C), temperature source Oral, resp. rate 18, SpO2 99 %. There is no height or weight on file to calculate BMI.  Past Psychiatric History:    Is the patient at  risk to self? No  Has the patient been a risk to self in the past 6 months? No .    Has the patient been a risk to self within the distant past? No   Is the patient a risk to others? No   Has the patient been a risk to others in the past 6 months? No   Has the patient been a risk to others within the distant past? No   Past Medical History:  Past Medical History:  Diagnosis Date   Bradycardia    had cardiology workup in 2013:  "structurally normal heart"   Food allergy    Tree Nuts   Gastroesophageal reflux    no current med.   Seasonal allergies    Thumb fracture 04/2014   right    Past Surgical History:  Procedure Laterality Date   ADENOIDECTOMY     OPEN REDUCTION INTERNAL FIXATION (ORIF) METACARPAL Right 05/04/2014   Procedure: OPEN REDUCTION INTERNAL FIXATION (ORIF) RIGHT THUMB ;  Surgeon: Cindee Salt, MD;  Location: [facility];  Service: Orthopedics;  Laterality: Right;   PERCUTANEOUS PINNING  04/11/2011   Procedure: PERCUTANEOUS PINNING EXTREMITY;  Surgeon: Nicki Reaper, MD;  Location: [facility];  Service: Orthopedics;  Laterality: Right;  PERCUTANEOUS PINNING RIGHT LITTLE AND RING FINGERS   TONSILLECTOMY  age 51 yrs.   TYMPANOSTOMY TUBE PLACEMENT  as an infant    Family History:  Family History  Problem Relation Age of Onset   Diabetes Maternal Grandfather    Kidney disease Maternal Grandfather        not on dialysis yet    Social History:  Social History   Socioeconomic History   Marital status: Single    Spouse name: Not on file   Number of children: Not on file   Years of education: Not on file   Highest education level: Not on file  Occupational History   Not on file  Tobacco Use   Smoking status: Former   Smokeless tobacco: Never  Vaping Use   Vaping Use: Former  Substance and Sexual Activity   Alcohol use: Yes   Drug use: Yes    Types: Marijuana   Sexual activity: Not on file  Other Topics Concern   Not on file   Social History Narrative   Not on file   Social Determinants of Health   Financial Resource Strain: Not on file  Food Insecurity: Not on file  Transportation Needs: Not on file  Physical Activity: Not on file  Stress: Not on file  Social Connections: Not on file  Intimate Partner Violence: Not on file    SDOH:  SDOH Screenings   Alcohol Screen: Not on file  Depression (PHQ2-9): Low Risk    PHQ-2 Score: 0  Financial Resource Strain: Not on file  Food Insecurity: Not on file  Housing: Not on file  Physical Activity: Not on file  Social Connections: Not on file  Stress: Not on file  Tobacco Use: Medium Risk   Smoking Tobacco Use: Former   Smokeless Tobacco Use: Never   Passive Exposure: Not on file  Transportation Needs: Not on file    Last Labs:  Admission on 11/12/2020  Component Date Value Ref Range Status   SARS Coronavirus 2 Ag 11/12/2020 Negative  Negative Final  Admission on 11/06/2020, Discharged on 11/06/2020  Component Date Value Ref Range Status   Sodium 11/06/2020 137  135 - 145 mmol/L Final   Potassium 11/06/2020 3.9  3.5 - 5.1 mmol/L Final   Chloride 11/06/2020 106  98 - 111 mmol/L Final   CO2 11/06/2020 23  22 - 32 mmol/L Final   Glucose, Bld 11/06/2020 87  70 - 99 mg/dL Final   Glucose reference range applies only to samples taken after fasting for at least 8 hours.   BUN 11/06/2020 7  6 - 20 mg/dL Final   Creatinine, Ser 11/06/2020 0.85  0.61 - 1.24 mg/dL Final   Calcium 83/29/1916 9.4  8.9 - 10.3 mg/dL Final   Total Protein 60/60/0459 6.9  6.5 - 8.1 g/dL Final   Albumin 97/74/1423 4.6  3.5 - 5.0 g/dL Final   AST 95/32/0233 35  15 - 41 U/L Final   ALT 11/06/2020 23  0 - 44 U/L Final   Alkaline Phosphatase 11/06/2020 51  38 - 126 U/L Final   Total Bilirubin 11/06/2020 1.3 (A)  0.3 - 1.2 mg/dL Final   GFR, Estimated 11/06/2020 >60  >60 mL/min Final   Comment: (NOTE) Calculated using the CKD-EPI Creatinine Equation (2021)    Anion gap 11/06/2020  8  5 - 15 Final   Performed at [facility], [address], [place], [place] [address]   Alcohol, Ethyl (B) 11/06/2020 <10  <10 mg/dL Final   Comment: (NOTE) Lowest detectable limit for serum alcohol is 10 mg/dL.  For medical purposes only. Performed at [facility], [address], [place], [place] [address]    WBC 11/06/2020 5.9  4.0 - 10.5 K/uL Final   RBC 11/06/2020 4.55  4.22 - 5.81 MIL/uL Final   Hemoglobin 11/06/2020 14.0  13.0 - 17.0 g/dL Final   HCT 72/90/2111 41.1  39.0 - 52.0 % Final   MCV 11/06/2020 90.3  80.0 - 100.0 fL Final   MCH 11/06/2020 30.8  26.0 - 34.0 pg Final   MCHC 11/06/2020 34.1  30.0 - 36.0 g/dL Final   RDW 55/20/8022 12.4  11.5 - 15.5 % Final   Platelets 11/06/2020 249  150 - 400 K/uL Final   nRBC 11/06/2020 0.0  0.0 - 0.2 % Final   Performed at [facility], [address] [place], [place] [address]   Opiates 11/06/2020 NONE DETECTED  NONE DETECTED Final   Cocaine 11/06/2020 NONE DETECTED  NONE DETECTED Final   Benzodiazepines 11/06/2020 NONE DETECTED  NONE DETECTED Final   Amphetamines 11/06/2020 NONE DETECTED  NONE DETECTED Final   Tetrahydrocannabinol 11/06/2020 POSITIVE (A)  NONE DETECTED Final   Barbiturates 11/06/2020 NONE DETECTED  NONE DETECTED Final   Comment: (NOTE) DRUG SCREEN FOR MEDICAL PURPOSES ONLY.  IF CONFIRMATION IS NEEDED FOR ANY PURPOSE, NOTIFY LAB WITHIN 5 DAYS.  LOWEST DETECTABLE LIMITS FOR URINE DRUG SCREEN Drug Class                     Cutoff (ng/mL) Amphetamine and metabolites    1000 Barbiturate and metabolites    200 Benzodiazepine                 200 Tricyclics and metabolites     300 Opiates and metabolites        300 Cocaine and metabolites        300 THC  50 Performed at [facility], [address], [place], [place] [address]    Color, Urine 11/06/2020 COLORLESS (A)  YELLOW Final   APPearance 11/06/2020  CLEAR  CLEAR Final   Specific Gravity, Urine 11/06/2020 <1.005 (A)  1.005 - 1.030 Final   pH 11/06/2020 7.0  5.0 - 8.0 Final   Glucose, UA 11/06/2020 NEGATIVE  NEGATIVE mg/dL Final   Hgb urine dipstick 11/06/2020 NEGATIVE  NEGATIVE Final   Bilirubin Urine 11/06/2020 NEGATIVE  NEGATIVE Final   Ketones, ur 11/06/2020 NEGATIVE  NEGATIVE mg/dL Final   Protein, ur 44/01/270 NEGATIVE  NEGATIVE mg/dL Final   Nitrite 53/66/4403 NEGATIVE  NEGATIVE Final   Leukocytes,Ua 11/06/2020 NEGATIVE  NEGATIVE Final   Performed at [facility], [address] [place], [place] [address]    Allergies: Tree extract  PTA Medications: (Not in a hospital admission)   Medical Decision Making  Recommend overnight observation Will restart Risperdal 2 mg p.o. nightly coupled with Cogentin 1 mg     Recommendations  Based on my evaluation the patient appears to have an emergency medical condition for which I recommend the patient be transferred to the emergency department for further evaluation.  Oneta Rack, NP 11/12/20  5:08 PM

## 2020-11-12 NOTE — Progress Notes (Signed)
   11/12/20 1604  [facility] Triage Screening (Walk-ins at [facility] only)  How Did You Hear About [place]? Family/Friend  What Is the Reason for Your Visit/Call Today? Kenneth Griffith reports to [facility] accompanied by his mother and stepfather for evaluation of insomnia. Pt is pt of Dr. Gilmore Laroche and recently had appt (yesterday). Pt has been taking respiridone but has been titrated off medication. Pts mother gave pt medication at a couple of isolated incidents when behavior was "off" and pt had reaction to medication. Pt denying SI, HI, or AVH.  How Long Has This Been Causing You Problems? 1 wk - 1 month  Have You Recently Had Any Thoughts About Hurting Yourself? No  Are You Planning to Commit Suicide/Harm Yourself At This time? No  Have you Recently Had Thoughts About Hurting Someone [name]? No  Are You Planning To Harm Someone At This Time? No  Are you currently experiencing any auditory, visual or other hallucinations? No  Have You Used Any Alcohol or Drugs in the Past 24 Hours? No (pt reports that he smoked THC one week ago)  Do you have any current medical co-morbidities that require immediate attention? No  Clinician description of patient physical appearance/behavior: paranoid, anxious  What Do You Feel Would Help You the Most Today? Medication(s)  If access to [facility] was not available, would you have sought care in the Emergency Department? Yes  Determination of Need Routine (7 days)  Options For Referral Medication Management
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
## 2020-11-12 NOTE — ED Notes (Signed)
Pt calm & cooperative at present, snack given.

## 2020-11-13 MED ORDER — TRAZODONE HCL 50 MG PO TABS: 50.0000 mg | ORAL_TABLET | Freq: Every evening | ORAL | Status: DC | PRN

## 2020-11-13 MED ORDER — RISPERIDONE 2 MG PO TABS: 2.0000 mg | ORAL_TABLET | Freq: Every day | ORAL | 0 refills | Status: DC

## 2020-11-13 MED ORDER — BENZTROPINE MESYLATE 1 MG PO TABS: 1.0000 mg | ORAL_TABLET | Freq: Every day | ORAL | 0 refills | Status: DC

## 2020-11-13 MED ORDER — TRAZODONE HCL 50 MG PO TABS: 50.0000 mg | ORAL_TABLET | Freq: Every evening | ORAL | 0 refills | Status: DC | PRN

## 2020-11-13 NOTE — ED Notes (Signed)
Pt sleeping at present, no distress noted.  Monitoring for safety. 

## 2020-11-13 NOTE — ED Provider Notes (Signed)
FBC/OBS ASAP Discharge Summary  Date and Time: 11/13/2020 9:47 AM  Name: Kenneth LIST  MRN:  [id number]   Discharge Diagnoses:  Final diagnoses:  Hypomania [facility])    Subjective: Kenneth Griffith was seen and evaluated face to face. Reported that he slept throughout the night. Continue to denied suicidal or homicidal ideations. Patient was initiated on trazodone 50 mg nightly. Continue Risperdal 2 mg nightly coupled with Cogentin 1 mg for EPS.    Patient continues present slight despondent and anxious. Patient reported he rested will last night. Patient to follow-up with Psychiatrist Kenneth Griffith for outpatient follow-up.  Support, encouragement and reassurance was provided.   Stay Summary: Per admission assessment note:"Kenneth Griffith reports to [facility] accompanied by his mother and stepfather for evaluation of insomnia. Pt is pt of Dr. Gilmore Griffith and recently had appt (yesterday). Pt has been taking respiridone but has been titrated off medication. Pts mother gave pt medication at a couple of isolated incidents when behavior was "off" and pt had reaction to medication. Pt denying SI, HI, or AVH"  Total Time spent with patient: 15 minutes  Past Psychiatric History:  Past Medical History:  Past Medical History:  Diagnosis Date   Bradycardia    had cardiology workup in 2013:  "structurally normal heart"   Food allergy    Tree Nuts   Gastroesophageal reflux    no current med.   Seasonal allergies    Thumb fracture 04/2014   right    Past Surgical History:  Procedure Laterality Date   ADENOIDECTOMY     OPEN REDUCTION INTERNAL FIXATION (ORIF) METACARPAL Right 05/04/2014   Procedure: OPEN REDUCTION INTERNAL FIXATION (ORIF) RIGHT THUMB ;  Surgeon: Cindee Salt, MD;  Location: [facility];  Service: Orthopedics;  Laterality: Right;   PERCUTANEOUS PINNING  04/11/2011   Procedure: PERCUTANEOUS PINNING EXTREMITY;  Surgeon: Nicki Reaper, MD;  Location: [facility];  Service: Orthopedics;   Laterality: Right;  PERCUTANEOUS PINNING RIGHT LITTLE AND RING FINGERS   TONSILLECTOMY  age 20 yrs.   TYMPANOSTOMY TUBE PLACEMENT  as an infant   Family History:  Family History  Problem Relation Age of Onset   Diabetes Maternal Grandfather    Kidney disease Maternal Grandfather        not on dialysis yet   Family Psychiatric History:  Social History:  Social History   Substance and Sexual Activity  Alcohol Use Yes     Social History   Substance and Sexual Activity  Drug Use Yes   Types: Marijuana    Social History   Socioeconomic History   Marital status: Single    Spouse name: Not on file   Number of children: Not on file   Years of education: Not on file   Highest education level: Not on file  Occupational History   Not on file  Tobacco Use   Smoking status: Former   Smokeless tobacco: Never  Vaping Use   Vaping Use: Former  Substance and Sexual Activity   Alcohol use: Yes   Drug use: Yes    Types: Marijuana   Sexual activity: Not on file  Other Topics Concern   Not on file  Social History Narrative   Not on file   Social Determinants of Health   Financial Resource Strain: Not on file  Food Insecurity: Not on file  Transportation Needs: Not on file  Physical Activity: Not on file  Stress: Not on file  Social Connections: Not on file  SDOH:  SDOH Screenings   Alcohol Screen: Not on file  Depression (PHQ2-9): Low Risk    PHQ-2 Score: 0  Financial Resource Strain: Not on file  Food Insecurity: Not on file  Housing: Not on file  Physical Activity: Not on file  Social Connections: Not on file  Stress: Not on file  Tobacco Use: Medium Risk   Smoking Tobacco Use: Former   Smokeless Tobacco Use: Never   Passive Exposure: Not on file  Transportation Needs: Not on file    Tobacco Cessation:  N/A, patient does not currently use tobacco products  Current Medications:  Current Facility-Administered Medications  Medication Dose Route Frequency  Provider Last Rate Last Admin   acetaminophen (TYLENOL) tablet 650 mg  650 mg Oral Q6H PRN Oneta Rack, NP       alum & mag hydroxide-simeth (MAALOX/MYLANTA) 200-200-20 MG/5ML suspension 30 mL  30 mL Oral Q4H PRN Oneta Rack, NP       benztropine (COGENTIN) tablet 1 mg  1 mg Oral QHS Oneta Rack, NP   1 mg at 11/12/20 2117   magnesium hydroxide (MILK OF MAGNESIA) suspension 30 mL  30 mL Oral Daily PRN Oneta Rack, NP       nicotine (NICODERM CQ - dosed in mg/24 hours) patch 21 mg  21 mg Transdermal Daily Vernard Gambles H, NP   21 mg at 11/12/20 1750   risperiDONE (RISPERDAL) tablet 2 mg  2 mg Oral QHS Oneta Rack, NP   2 mg at 11/12/20 2117   traZODone (DESYREL) tablet 50 mg  50 mg Oral QHS,MR X 1 Oneta Rack, NP       Current Outpatient Medications  Medication Sig Dispense Refill   AUVI-Q 0.3 MG/0.3ML SOAJ injection Use as directed for life-threatening allergic reaction. 4 Device 3   benzocaine (ORAJEL) 10 % mucosal gel Use as directed in the mouth or throat 3 (three) times daily as needed for mouth pain (for oral pain, oral blister on tongue). 5.3 g 0   benztropine (COGENTIN) 1 MG tablet Take 1 tablet (1 mg total) by mouth at bedtime. 30 tablet 0   loratadine (CLARITIN) 10 MG tablet Take 10 mg by mouth daily as needed for allergies.     nicotine (NICODERM CQ - DOSED IN MG/24 HOURS) 21 mg/24hr patch Place 1 patch (21 mg total) onto the skin daily. 28 patch 0   risperiDONE (RISPERDAL) 2 MG tablet Take 1 tablet (2 mg total) by mouth at bedtime. 30 tablet 0   traZODone (DESYREL) 50 MG tablet Take 1 tablet (50 mg total) by mouth at bedtime and may repeat dose one time if needed. 15 tablet 0    PTA Medications: (Not in a hospital admission)   Musculoskeletal  Strength & Muscle Tone: within normal limits Gait & Station: normal Patient leans: N/A  Psychiatric Specialty Exam  Presentation  General Appearance: Appropriate for Environment  Eye  Contact:Good  Speech:Clear and Coherent  Speech Volume:Normal  Handedness:Right   Mood and Affect  Mood:Anxious  Affect:Congruent   Thought Process  Thought Processes:Coherent  Descriptions of Associations:Intact  Orientation:Full (Time, Place and Person)  Thought Content:Logical; Rumination  Diagnosis of Schizophrenia or Schizoaffective disorder in past: No    Hallucinations:Hallucinations: None  Ideas of Reference:None  Suicidal Thoughts:Suicidal Thoughts: No  Homicidal Thoughts:Homicidal Thoughts: No   Sensorium  Memory:Recent Good; Remote Good  Judgment:Good  Insight:Good   Executive Functions  Concentration:Fair  Attention Span:Good  Recall:Good  Fund of  Knowledge:Good  Language:Good   Psychomotor Activity  Psychomotor Activity:Psychomotor Activity: Normal   Assets  Assets:Resilience; Social Support   Sleep  Sleep:Sleep: Fair   Nutritional Assessment (For OBS and FBC admissions only) Has the patient had a weight loss or gain of 10 pounds or more in the last 3 months?: No Has the patient had a decrease in food intake/or appetite?: No Does the patient have dental problems?: No Does the patient have eating habits or behaviors that may be indicators of an eating disorder including binging or inducing vomiting?: No Has the patient recently lost weight without trying?: 0 Has the patient been eating poorly because of a decreased appetite?: 0 Malnutrition Screening Tool Score: 0    Physical Exam  Physical Exam Vitals and nursing note reviewed.  HENT:     Head: Normocephalic.  Cardiovascular:     Rate and Rhythm: Normal rate and regular rhythm.  Pulmonary:     Effort: Pulmonary effort is normal.  Neurological:     General: No focal deficit present.     Mental Status: He is oriented to person, place, and time.  Psychiatric:        Mood and Affect: Mood normal.        Behavior: Behavior normal.   Review of Systems   Constitutional: Negative.   Cardiovascular: Negative.   Musculoskeletal: Negative.   Psychiatric/Behavioral:  Positive for depression. Negative for suicidal ideas. The patient is nervous/anxious.   All other systems reviewed and are negative. Blood pressure 114/64, pulse 82, temperature 98 F (36.7 C), temperature source Oral, resp. rate 16, SpO2 98 %. There is no height or weight on file to calculate BMI.  Demographic Factors:  Adolescent or young adult  Loss Factors: NA  Historical Factors: Impulsivity  Risk Reduction Factors:   Positive therapeutic relationship and Positive coping skills or problem solving skills  Continued Clinical Symptoms:  Severe Anxiety and/or Agitation  Cognitive Features That Contribute To Risk:  Closed-mindedness    Suicide Risk:  Minimal: No identifiable suicidal ideation.  Patients presenting with no risk factors but with morbid ruminations; may be classified as minimal risk based on the severity of the depressive symptoms  Plan Of Care/Follow-up recommendations:  Activity:  as tolerated Diet:  heart healthy  Disposition: Take all medications as prescribed. Keep all follow-up appointments as scheduled.  Do not consume alcohol or use illegal drugs while on prescription medications. Report any adverse effects from your medications to your primary care provider promptly.  In the event of recurrent symptoms or worsening symptoms, call 911, a crisis hotline, or go to the nearest emergency department for evaluation.    Oneta Rack, NP 11/13/2020, 9:47 AM

## 2020-11-13 NOTE — ED Notes (Signed)
Discharge instructions provided and Pt stated understanding. Pt alert, orient and ambulatory prior to d/c from facility. Personal belongings returned from locker. Pt escorted to the front lobby to meet parents to d/c from facility. Safety maintained.

## 2020-11-17 ENCOUNTER — Telehealth (HOSPITAL_COMMUNITY): Payer: Self-pay | Admitting: Psychiatry

## 2020-11-17 ENCOUNTER — Telehealth (HOSPITAL_COMMUNITY): Payer: Self-pay | Admitting: Pediatrics

## 2020-11-17 NOTE — BH Assessment (Signed)
Care Management - Follow Up [facility] Discharges   Writer attempted to make contact with patient today and was unsuccessful.  Voice mailbox is full.    Per chart review, patient has a follow up appointment with Dr. Gilmore Laroche on 11-17-20.

## 2020-11-17 NOTE — Telephone Encounter (Signed)
Dr. Gilmore Laroche asked me to dismiss this pt because he did not want to do follow ups or meds- The pt is called yesterday because he now wants to do follow ups and med management.  Do I have permission to remove the dismissal and schd him?

## 2020-11-17 NOTE — Telephone Encounter (Signed)
I called patient and told him that Dr. Gilmore Laroche says to strart thee risperidone. He says he wanted to speak with Dr. Gilmore Laroche about the medication. I asked patient what questions he had and he said that he wanted to make an appt. I told him Dr.Akhtar wanted to see him back in 1 mo after starting the medication. Patient wanted sooner appt. I told patient we could get him in the week after next. He said that he would just start the risperidone and call back to make an appt.

## 2020-11-18 ENCOUNTER — Telehealth (HOSPITAL_COMMUNITY): Payer: Self-pay

## 2020-11-18 NOTE — Telephone Encounter (Signed)
Patient called requesting another refill on Cogentin, Risperdal and Trazodone. Says he does not want to run out while on vacation. Medications were refilled on 11/13/20. Spoke with him yesterday and he did not want to make appt yet. Told me he would call back for to make one. [facility] in [place]

## 2020-11-21 ENCOUNTER — Other Ambulatory Visit (HOSPITAL_COMMUNITY): Payer: Self-pay | Admitting: *Deleted

## 2020-11-21 DIAGNOSIS — F23 Brief psychotic disorder: Secondary | ICD-10-CM

## 2020-11-21 DIAGNOSIS — F12959 Cannabis use, unspecified with psychotic disorder, unspecified: Secondary | ICD-10-CM

## 2020-11-21 MED ORDER — TRAZODONE HCL 50 MG PO TABS: 50.0000 mg | ORAL_TABLET | Freq: Every evening | ORAL | 0 refills | Status: DC | PRN

## 2020-11-21 MED ORDER — RISPERIDONE 2 MG PO TABS: 2.0000 mg | ORAL_TABLET | Freq: Every day | ORAL | 0 refills | Status: DC

## 2020-11-21 MED ORDER — BENZTROPINE MESYLATE 1 MG PO TABS: 1.0000 mg | ORAL_TABLET | Freq: Every day | ORAL | 0 refills | Status: DC

## 2020-11-21 NOTE — Telephone Encounter (Signed)
sent 

## 2020-11-24 ENCOUNTER — Other Ambulatory Visit (HOSPITAL_COMMUNITY): Payer: Self-pay | Admitting: Family

## 2020-11-24 DIAGNOSIS — F12959 Cannabis use, unspecified with psychotic disorder, unspecified: Secondary | ICD-10-CM

## 2020-11-24 DIAGNOSIS — F23 Brief psychotic disorder: Secondary | ICD-10-CM

## 2020-12-18 ENCOUNTER — Other Ambulatory Visit (HOSPITAL_COMMUNITY): Payer: Self-pay | Admitting: Psychiatry

## 2020-12-18 DIAGNOSIS — F23 Brief psychotic disorder: Secondary | ICD-10-CM

## 2020-12-18 DIAGNOSIS — F12959 Cannabis use, unspecified with psychotic disorder, unspecified: Secondary | ICD-10-CM

## 2020-12-20 ENCOUNTER — Other Ambulatory Visit (HOSPITAL_COMMUNITY): Payer: Self-pay | Admitting: Psychiatry

## 2020-12-20 DIAGNOSIS — F12959 Cannabis use, unspecified with psychotic disorder, unspecified: Secondary | ICD-10-CM

## 2020-12-20 DIAGNOSIS — F23 Brief psychotic disorder: Secondary | ICD-10-CM

## 2021-01-05 ENCOUNTER — Other Ambulatory Visit (HOSPITAL_COMMUNITY): Payer: Self-pay | Admitting: Psychiatry

## 2021-01-05 DIAGNOSIS — F12959 Cannabis use, unspecified with psychotic disorder, unspecified: Secondary | ICD-10-CM

## 2021-01-05 DIAGNOSIS — F23 Brief psychotic disorder: Secondary | ICD-10-CM

## 2021-01-13 ENCOUNTER — Other Ambulatory Visit (HOSPITAL_COMMUNITY): Payer: Self-pay | Admitting: Psychiatry

## 2021-01-13 DIAGNOSIS — F12959 Cannabis use, unspecified with psychotic disorder, unspecified: Secondary | ICD-10-CM

## 2021-01-13 DIAGNOSIS — F23 Brief psychotic disorder: Secondary | ICD-10-CM

## 2021-12-11 IMAGING — CT CT HEAD W/O CM
4 series · 16 of 47 positions shown, 18 images · non-contrast
Comparison: None.

CLINICAL DATA: 18-year-old male with altered mental status.

EXAM:
CT HEAD WITHOUT CONTRAST
TECHNIQUE: Contiguous axial images were obtained from the base of the skull
through the vertex without intravenous contrast.

[Series 3: head without · axial · non-contrast · 0.45mm/px · z∈[-103,+17]mm · 7 of 33 slices shown, 9 images]
[im 5/33  brain]
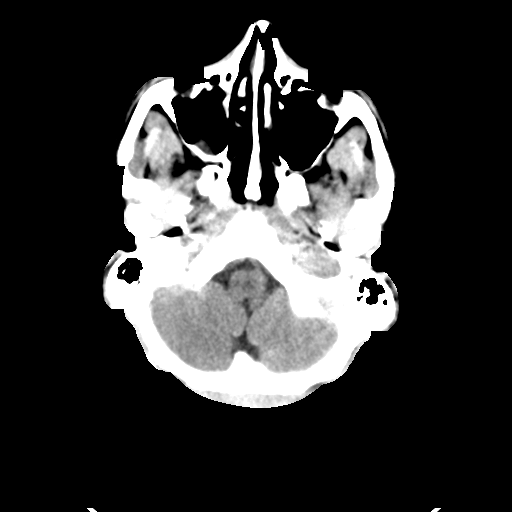
[im 5/33  bone]
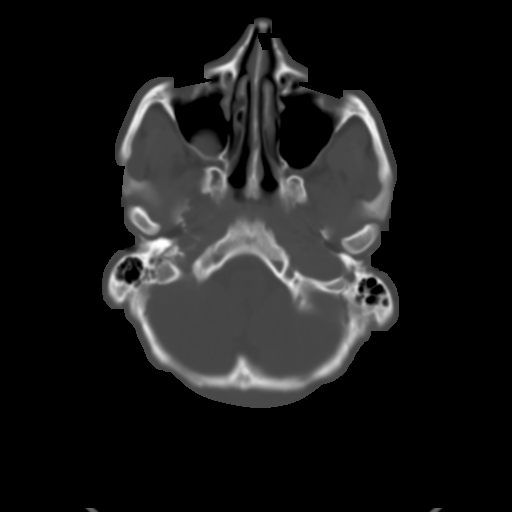
[im 9/33  brain]
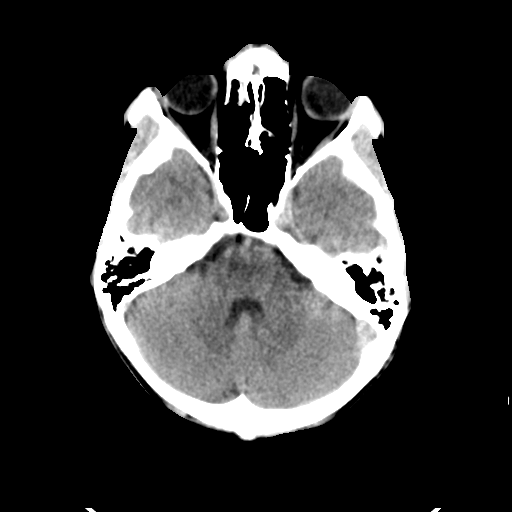
[im 13/33  brain]
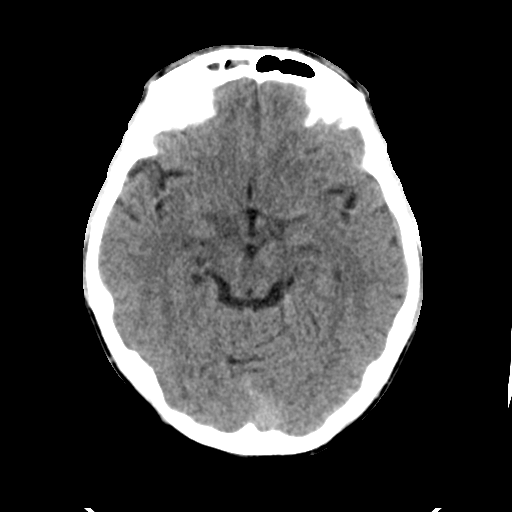
[im 17/33  brain]
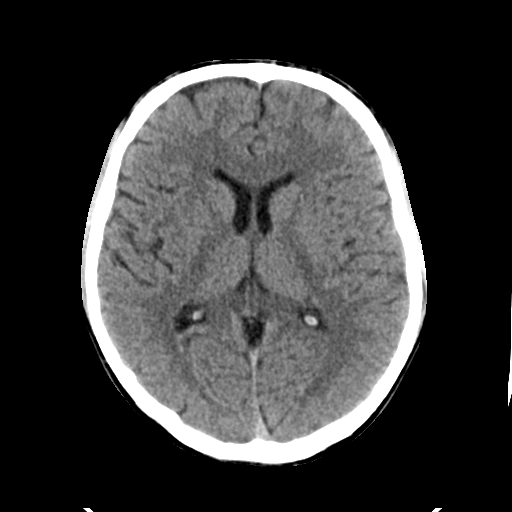
[im 21/33  brain]
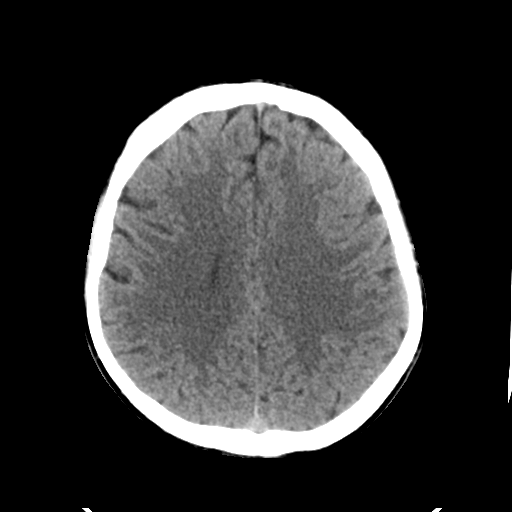
[im 21/33  bone]
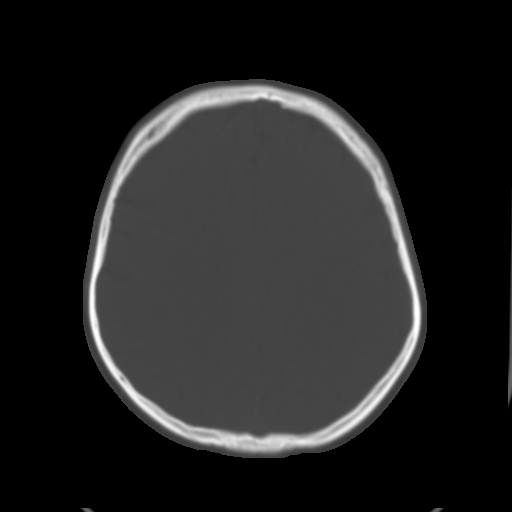
[im 25/33  brain]
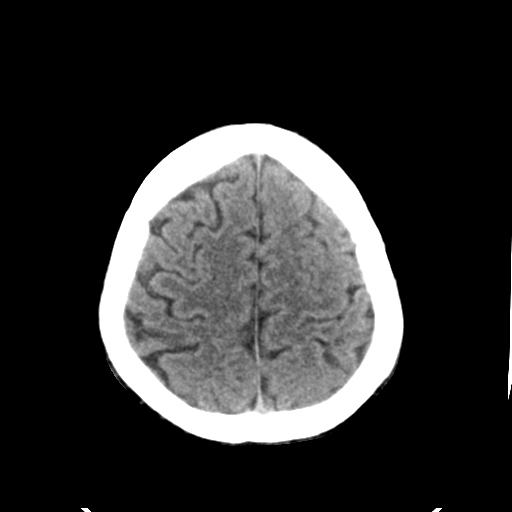
[im 29/33  brain]
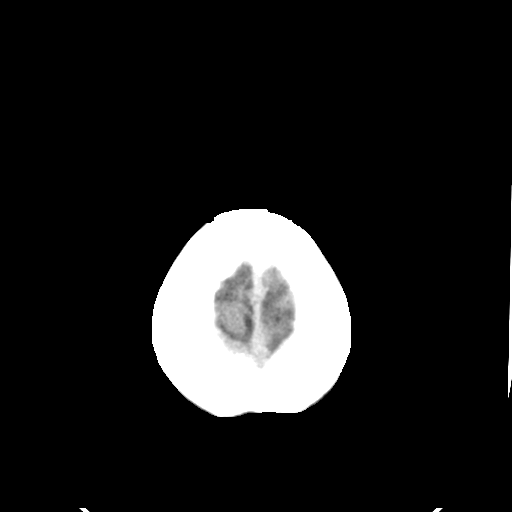

[Series 4: head bone · axial · 0.45mm/px · z∈[-107,-75]mm · 3 of 82 slices shown]
[im 9/82  bone]
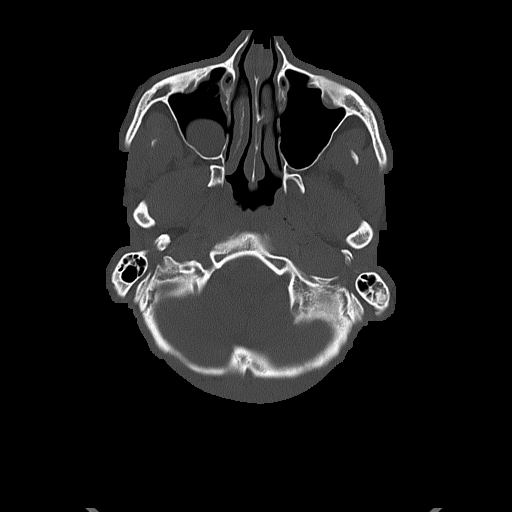
[im 17/82  bone]
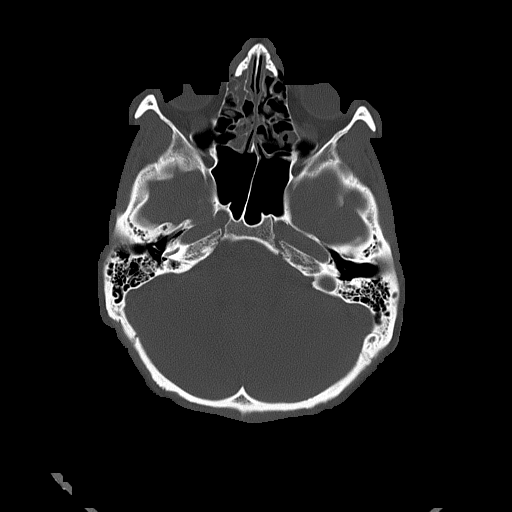
[im 25/82  bone]
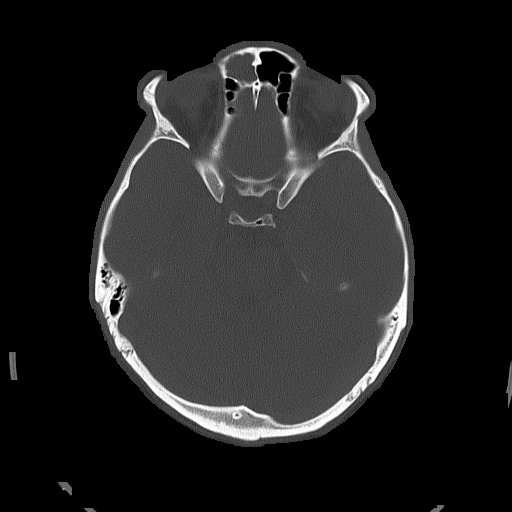

[Series 5: head without cor · coronal · non-contrast · 0.32mm/px · 3 of 75 slices shown]
[im 25/75  brain]
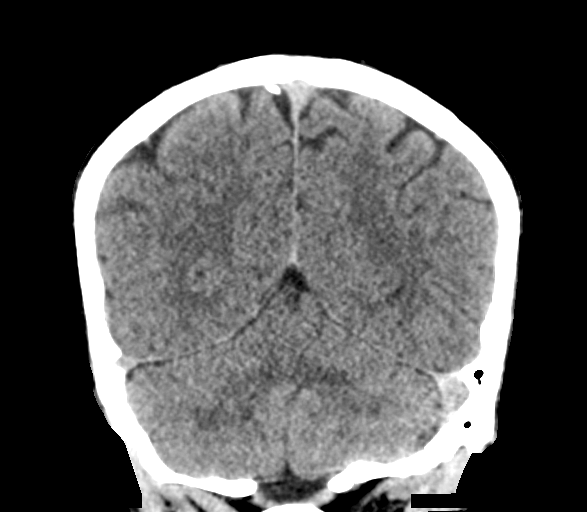
[im 33/75  brain]
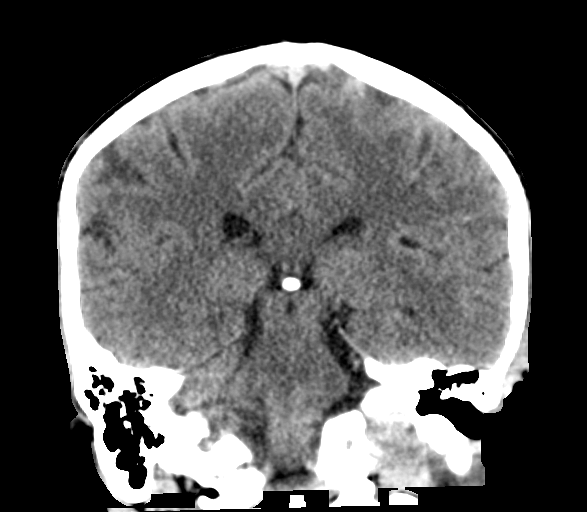
[im 42/75  brain]
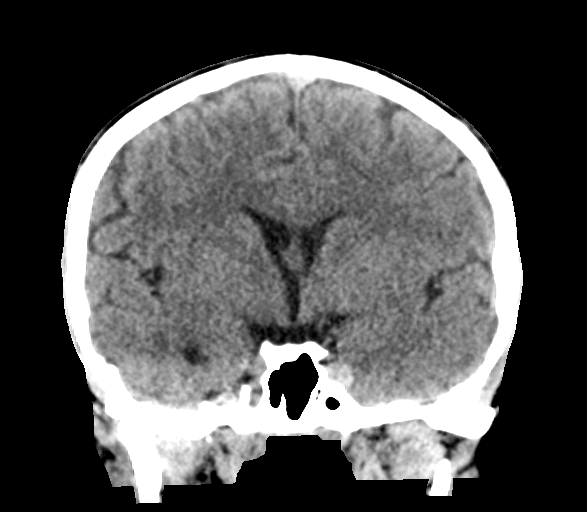

[Series 6: head without sag · sagittal · non-contrast · 0.32mm/px · 3 of 63 slices shown]
[im 21/63  brain]
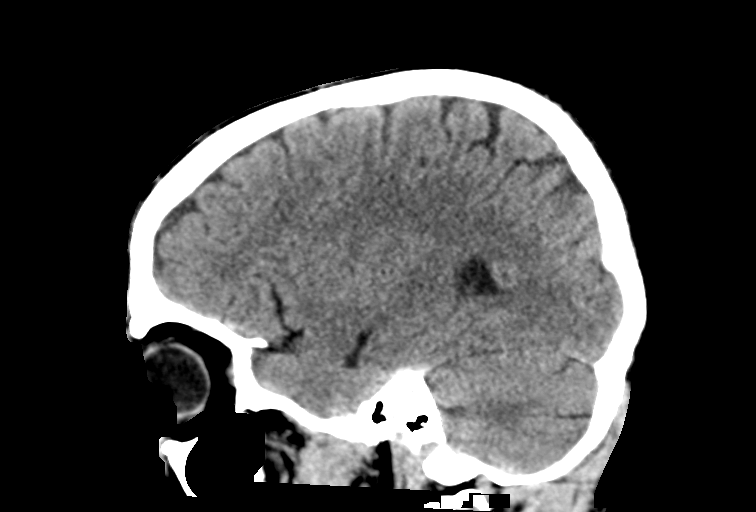
[im 32/63  brain]
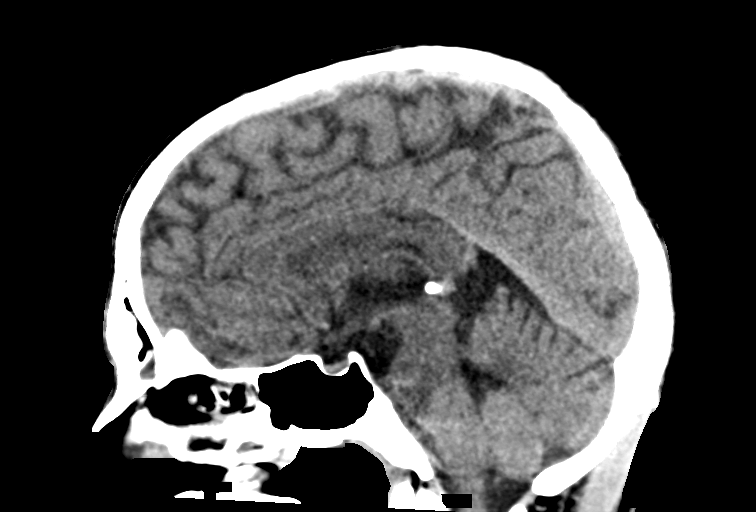
[im 42/63  brain]
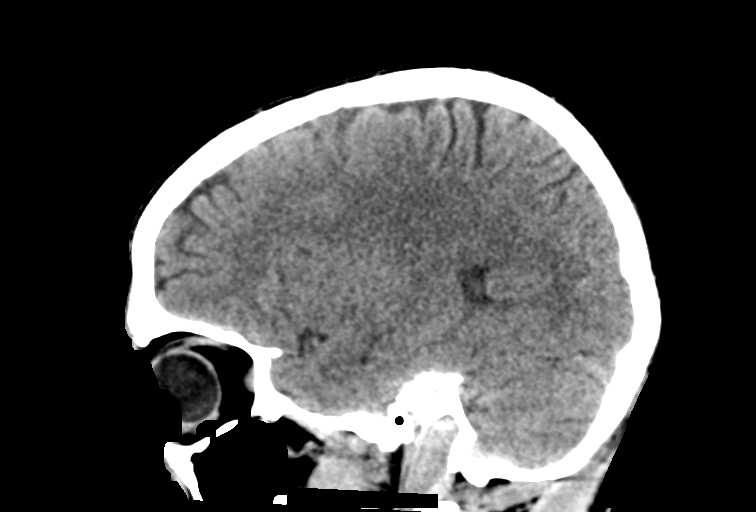

[16 of 47 positions shown; findings below may reference images not displayed]

FINDINGS: Brain: No evidence of acute infarction, hemorrhage, hydrocephalus,
extra-axial collection or mass lesion/mass effect.

Vascular: No hyperdense vessel or unexpected calcification.

Skull: Normal. Negative for fracture or focal lesion.

Sinuses/Orbits: There is diffuse mucoperiosteal thickening of
paranasal sinuses. There is complete opacification of the right
frontal sinus. Right maxillary sinus retention cyst or polyp. No
air-fluid level. The mastoid air cells are clear.

Other: None
IMPRESSION: 1. Normal noncontrast CT of the brain.
2. Paranasal sinus disease.

## 2022-04-07 DIAGNOSIS — I469 Cardiac arrest, cause unspecified: Secondary | ICD-10-CM | POA: Diagnosis not present

## 2022-04-09 DEATH — deceased
